# Patient Record
Sex: Male | Born: 1950
Health system: Southern US, Community
[De-identification: ages and names within clinical notes are randomized; demographics above are authoritative.]

## PROBLEM LIST (undated history)

## (undated) DIAGNOSIS — I219 Acute myocardial infarction, unspecified: Secondary | ICD-10-CM

## (undated) DIAGNOSIS — M1990 Unspecified osteoarthritis, unspecified site: Secondary | ICD-10-CM

## (undated) DIAGNOSIS — F419 Anxiety disorder, unspecified: Secondary | ICD-10-CM

## (undated) DIAGNOSIS — H269 Unspecified cataract: Secondary | ICD-10-CM

## (undated) DIAGNOSIS — F32A Depression, unspecified: Secondary | ICD-10-CM

## (undated) DIAGNOSIS — I10 Essential (primary) hypertension: Secondary | ICD-10-CM

## (undated) DIAGNOSIS — F329 Major depressive disorder, single episode, unspecified: Secondary | ICD-10-CM

## (undated) DIAGNOSIS — T7840XA Allergy, unspecified, initial encounter: Secondary | ICD-10-CM

## (undated) DIAGNOSIS — D649 Anemia, unspecified: Secondary | ICD-10-CM

## (undated) HISTORY — DX: Essential (primary) hypertension: I10

## (undated) HISTORY — DX: Major depressive disorder, single episode, unspecified: F32.9

## (undated) HISTORY — DX: Unspecified cataract: H26.9

## (undated) HISTORY — DX: Allergy, unspecified, initial encounter: T78.40XA

## (undated) HISTORY — DX: Acute myocardial infarction, unspecified: I21.9

## (undated) HISTORY — DX: Depression, unspecified: F32.A

## (undated) HISTORY — PX: FRACTURE SURGERY: SHX138

## (undated) HISTORY — DX: Unspecified osteoarthritis, unspecified site: M19.90

## (undated) HISTORY — PX: EYE SURGERY: SHX253

## (undated) HISTORY — DX: Anemia, unspecified: D64.9

## (undated) HISTORY — PX: CORONARY ARTERY BYPASS GRAFT: SHX141

## (undated) HISTORY — DX: Anxiety disorder, unspecified: F41.9

---

## 1998-02-17 ENCOUNTER — Inpatient Hospital Stay (HOSPITAL_COMMUNITY): Admission: EM | Admit: 1998-02-17 | Discharge: 1998-02-18 | Payer: Self-pay | Admitting: Cardiology

## 2001-03-26 ENCOUNTER — Encounter: Payer: Self-pay | Admitting: Cardiology

## 2001-03-26 ENCOUNTER — Inpatient Hospital Stay (HOSPITAL_COMMUNITY): Admission: AD | Admit: 2001-03-26 | Discharge: 2001-04-03 | Payer: Self-pay | Admitting: Cardiology

## 2001-03-26 ENCOUNTER — Encounter: Payer: Self-pay | Admitting: Family Medicine

## 2001-04-17 ENCOUNTER — Ambulatory Visit (HOSPITAL_COMMUNITY): Admission: RE | Admit: 2001-04-17 | Discharge: 2001-04-17 | Payer: Self-pay | Admitting: Cardiovascular Disease

## 2001-04-30 ENCOUNTER — Ambulatory Visit (HOSPITAL_COMMUNITY): Admission: RE | Admit: 2001-04-30 | Discharge: 2001-05-01 | Payer: Self-pay | Admitting: Internal Medicine

## 2002-05-16 ENCOUNTER — Inpatient Hospital Stay (HOSPITAL_COMMUNITY): Admission: EM | Admit: 2002-05-16 | Discharge: 2002-05-19 | Payer: Self-pay | Admitting: Emergency Medicine

## 2002-05-16 ENCOUNTER — Encounter: Payer: Self-pay | Admitting: Emergency Medicine

## 2002-06-27 ENCOUNTER — Encounter (HOSPITAL_COMMUNITY): Admission: RE | Admit: 2002-06-27 | Discharge: 2002-07-27 | Payer: Self-pay | Admitting: *Deleted

## 2002-07-31 ENCOUNTER — Encounter (HOSPITAL_COMMUNITY): Admission: RE | Admit: 2002-07-31 | Discharge: 2002-08-30 | Payer: Self-pay | Admitting: *Deleted

## 2004-12-01 ENCOUNTER — Ambulatory Visit (HOSPITAL_COMMUNITY): Admission: RE | Admit: 2004-12-01 | Discharge: 2004-12-01 | Payer: Self-pay | Admitting: Cardiology

## 2004-12-05 ENCOUNTER — Inpatient Hospital Stay (HOSPITAL_COMMUNITY): Admission: EM | Admit: 2004-12-05 | Discharge: 2004-12-08 | Payer: Self-pay | Admitting: Emergency Medicine

## 2004-12-23 ENCOUNTER — Ambulatory Visit (HOSPITAL_COMMUNITY): Admission: AD | Admit: 2004-12-23 | Discharge: 2004-12-24 | Payer: Self-pay | Admitting: Cardiology

## 2005-04-04 ENCOUNTER — Ambulatory Visit (HOSPITAL_COMMUNITY): Admission: RE | Admit: 2005-04-04 | Discharge: 2005-04-04 | Payer: Self-pay | Admitting: Cardiology

## 2005-07-12 ENCOUNTER — Inpatient Hospital Stay (HOSPITAL_BASED_OUTPATIENT_CLINIC_OR_DEPARTMENT_OTHER): Admission: RE | Admit: 2005-07-12 | Discharge: 2005-07-12 | Payer: Self-pay | Admitting: Cardiology

## 2006-03-29 ENCOUNTER — Ambulatory Visit (HOSPITAL_COMMUNITY): Admission: RE | Admit: 2006-03-29 | Discharge: 2006-03-29 | Payer: Self-pay | Admitting: Cardiology

## 2006-04-25 ENCOUNTER — Inpatient Hospital Stay (HOSPITAL_BASED_OUTPATIENT_CLINIC_OR_DEPARTMENT_OTHER): Admission: RE | Admit: 2006-04-25 | Discharge: 2006-04-25 | Payer: Self-pay | Admitting: Cardiology

## 2006-05-04 ENCOUNTER — Ambulatory Visit (HOSPITAL_COMMUNITY): Admission: RE | Admit: 2006-05-04 | Discharge: 2006-05-05 | Payer: Self-pay | Admitting: Cardiology

## 2007-02-27 ENCOUNTER — Inpatient Hospital Stay (HOSPITAL_BASED_OUTPATIENT_CLINIC_OR_DEPARTMENT_OTHER): Admission: RE | Admit: 2007-02-27 | Discharge: 2007-02-27 | Payer: Self-pay | Admitting: Cardiology

## 2007-03-23 ENCOUNTER — Ambulatory Visit (HOSPITAL_COMMUNITY): Admission: RE | Admit: 2007-03-23 | Discharge: 2007-03-23 | Payer: Self-pay | Admitting: Cardiology

## 2008-05-15 ENCOUNTER — Encounter: Admission: RE | Admit: 2008-05-15 | Discharge: 2008-05-15 | Payer: Self-pay | Admitting: Family Medicine

## 2008-06-10 ENCOUNTER — Encounter (HOSPITAL_COMMUNITY): Admission: RE | Admit: 2008-06-10 | Discharge: 2008-08-20 | Payer: Self-pay | Admitting: Cardiology

## 2008-08-06 ENCOUNTER — Encounter (INDEPENDENT_AMBULATORY_CARE_PROVIDER_SITE_OTHER): Payer: Self-pay | Admitting: General Surgery

## 2008-08-06 ENCOUNTER — Ambulatory Visit (HOSPITAL_COMMUNITY): Admission: RE | Admit: 2008-08-06 | Discharge: 2008-08-07 | Payer: Self-pay | Admitting: General Surgery

## 2009-03-25 ENCOUNTER — Encounter (HOSPITAL_COMMUNITY): Admission: RE | Admit: 2009-03-25 | Discharge: 2009-05-19 | Payer: Self-pay | Admitting: Cardiology

## 2009-06-05 ENCOUNTER — Inpatient Hospital Stay (HOSPITAL_COMMUNITY): Admission: EM | Admit: 2009-06-05 | Discharge: 2009-06-07 | Payer: Self-pay | Admitting: Emergency Medicine

## 2010-02-06 ENCOUNTER — Encounter: Payer: Self-pay | Admitting: Cardiology

## 2010-03-18 ENCOUNTER — Other Ambulatory Visit (HOSPITAL_COMMUNITY): Payer: Self-pay | Admitting: Cardiology

## 2010-03-18 ENCOUNTER — Ambulatory Visit
Admission: RE | Admit: 2010-03-18 | Discharge: 2010-03-18 | Disposition: A | Discharge status: Home / Self Care | Payer: MEDICARE | Source: Ambulatory Visit | Attending: Cardiology | Admitting: Cardiology

## 2010-03-18 ENCOUNTER — Other Ambulatory Visit: Payer: Self-pay | Admitting: Cardiology

## 2010-03-18 DIAGNOSIS — I2510 Atherosclerotic heart disease of native coronary artery without angina pectoris: Secondary | ICD-10-CM

## 2010-04-05 LAB — CBC
HCT: 21.9 % — ABNORMAL LOW (ref 39.0–52.0)
HCT: 30.2 % — ABNORMAL LOW (ref 39.0–52.0)
Hemoglobin: 7.7 g/dL — ABNORMAL LOW (ref 13.0–17.0)
Hemoglobin: 9.5 g/dL — ABNORMAL LOW (ref 13.0–17.0)
MCHC: 30.5 g/dL (ref 30.0–36.0)
MCHC: 31.6 g/dL (ref 30.0–36.0)
MCV: 58.4 fL — ABNORMAL LOW (ref 78.0–100.0)
MCV: 64.9 fL — ABNORMAL LOW (ref 78.0–100.0)
MCV: 69.4 fL — ABNORMAL LOW (ref 78.0–100.0)
Platelets: 514 10*3/uL — ABNORMAL HIGH (ref 150–400)
RBC: 3.75 MIL/uL — ABNORMAL LOW (ref 4.22–5.81)
RBC: 3.84 MIL/uL — ABNORMAL LOW (ref 4.22–5.81)
RBC: 3.96 MIL/uL — ABNORMAL LOW (ref 4.22–5.81)
RBC: 4.35 MIL/uL (ref 4.22–5.81)
RDW: 30 % — ABNORMAL HIGH (ref 11.5–15.5)
RDW: 32 % — ABNORMAL HIGH (ref 11.5–15.5)
WBC: 8.4 10*3/uL (ref 4.0–10.5)
WBC: 8.5 10*3/uL (ref 4.0–10.5)

## 2010-04-05 LAB — COMPREHENSIVE METABOLIC PANEL
ALT: 12 U/L (ref 0–53)
ALT: 12 U/L (ref 0–53)
ALT: 16 U/L (ref 0–53)
AST: 19 U/L (ref 0–37)
AST: 20 U/L (ref 0–37)
Alkaline Phosphatase: 61 U/L (ref 39–117)
Alkaline Phosphatase: 66 U/L (ref 39–117)
BUN: 11 mg/dL (ref 6–23)
CO2: 24 mEq/L (ref 19–32)
CO2: 28 mEq/L (ref 19–32)
Calcium: 8.6 mg/dL (ref 8.4–10.5)
Calcium: 8.9 mg/dL (ref 8.4–10.5)
Chloride: 108 mEq/L (ref 96–112)
Creatinine, Ser: 1.24 mg/dL (ref 0.4–1.5)
GFR calc Af Amer: >60 mL/min (ref >60)
GFR calc Af Amer: >60 mL/min (ref >60)
GFR calc non Af Amer: 59 mL/min — ABNORMAL LOW (ref >60)
GFR calc non Af Amer: >60 mL/min (ref >60)
Glucose, Bld: 91 mg/dL (ref 70–99)
Glucose, Bld: 91 mg/dL (ref 70–99)
Potassium: 4.1 mEq/L (ref 3.5–5.1)
Potassium: 4.1 mEq/L (ref 3.5–5.1)
Sodium: 136 mEq/L (ref 135–145)
Sodium: 137 mEq/L (ref 135–145)
Sodium: 137 mEq/L (ref 135–145)
Total Bilirubin: 0.8 mg/dL (ref 0.3–1.2)
Total Protein: 6.3 g/dL (ref 6.0–8.3)
Total Protein: 6.5 g/dL (ref 6.0–8.3)

## 2010-04-05 LAB — DIFFERENTIAL
Basophils Absolute: 0 10*3/uL (ref 0.0–0.1)
Basophils Relative: 0 % (ref 0–1)
Basophils Relative: 1 % (ref 0–1)
Eosinophils Relative: 0 % (ref 0–5)
Eosinophils Relative: 2 % (ref 0–5)
Eosinophils Relative: 2 % (ref 0–5)
Lymphs Abs: 2.1 10*3/uL (ref 0.7–4.0)
Lymphs Abs: 2.4 10*3/uL (ref 0.7–4.0)
Lymphs Abs: 2.6 10*3/uL (ref 0.7–4.0)
Monocytes Absolute: 0.8 10*3/uL (ref 0.1–1.0)
Monocytes Absolute: 1.3 10*3/uL — ABNORMAL HIGH (ref 0.1–1.0)
Monocytes Relative: 10 % (ref 3–12)
Monocytes Relative: 13 % — ABNORMAL HIGH (ref 3–12)
Monocytes Relative: 13 % — ABNORMAL HIGH (ref 3–12)
Neutro Abs: 6.5 10*3/uL (ref 1.7–7.7)
Neutrophils Relative %: 61 % (ref 43–77)

## 2010-04-05 LAB — BASIC METABOLIC PANEL
GFR calc non Af Amer: >60 mL/min (ref >60)
Potassium: 3.9 mEq/L (ref 3.5–5.1)
Sodium: 137 mEq/L (ref 135–145)

## 2010-04-05 LAB — IRON AND TIBC
Iron: 25 ug/dL — ABNORMAL LOW (ref 42–135)
Saturation Ratios: 6 % — ABNORMAL LOW (ref 20–55)
TIBC: 428 ug/dL (ref 215–435)

## 2010-04-05 LAB — CROSSMATCH
ABO/RH(D): O POS
Antibody Screen: NEGATIVE

## 2010-04-05 LAB — CARDIAC PANEL(CRET KIN+CKTOT+MB+TROPI)
CK, MB: 1.2 ng/mL (ref 0.3–4.0)
Relative Index: INVALID (ref 0.0–2.5)
Total CK: 63 U/L (ref 7–232)

## 2010-04-05 LAB — HEPATIC FUNCTION PANEL
ALT: 18 U/L (ref 0–53)
AST: 23 U/L (ref 0–37)
Bilirubin, Direct: <0.1 mg/dL (ref 0.0–0.3)

## 2010-04-05 LAB — TROPONIN I: Troponin I: 0.04 ng/mL (ref 0.00–0.06)

## 2010-04-05 LAB — POCT CARDIAC MARKERS
CKMB, poc: <1 ng/mL — ABNORMAL LOW (ref 1.0–8.0)
Myoglobin, poc: 56.5 ng/mL (ref 12–200)
Myoglobin, poc: 68.9 ng/mL (ref 12–200)
Troponin i, poc: 0.07 ng/mL (ref 0.00–0.09)

## 2010-04-05 LAB — CK TOTAL AND CKMB (NOT AT ARMC)
CK, MB: 1.2 ng/mL (ref 0.3–4.0)
Total CK: 67 U/L (ref 7–232)

## 2010-04-05 LAB — FOLATE: Folate: >20 ng/mL

## 2010-04-05 LAB — MAGNESIUM
Magnesium: 2 mg/dL (ref 1.5–2.5)
Magnesium: 2.1 mg/dL (ref 1.5–2.5)

## 2010-04-05 LAB — FERRITIN: Ferritin: 3 ng/mL — ABNORMAL LOW (ref 22–322)

## 2010-04-12 ENCOUNTER — Other Ambulatory Visit (HOSPITAL_COMMUNITY): Payer: Self-pay

## 2010-04-12 ENCOUNTER — Encounter (HOSPITAL_COMMUNITY)
Admission: RE | Admit: 2010-04-12 | Discharge: 2010-04-12 | Disposition: A | Discharge status: Home / Self Care | Payer: MEDICARE | Source: Ambulatory Visit | Attending: Cardiology | Admitting: Cardiology

## 2010-04-12 ENCOUNTER — Ambulatory Visit (HOSPITAL_COMMUNITY)
Admission: RE | Admit: 2010-04-12 | Discharge: 2010-04-12 | Disposition: A | Discharge status: Home / Self Care | Payer: MEDICARE | Source: Ambulatory Visit | Attending: Cardiology | Admitting: Cardiology

## 2010-04-12 DIAGNOSIS — E785 Hyperlipidemia, unspecified: Secondary | ICD-10-CM | POA: Insufficient documentation

## 2010-04-12 DIAGNOSIS — R079 Chest pain, unspecified: Secondary | ICD-10-CM | POA: Insufficient documentation

## 2010-04-12 DIAGNOSIS — I10 Essential (primary) hypertension: Secondary | ICD-10-CM | POA: Insufficient documentation

## 2010-04-12 DIAGNOSIS — I252 Old myocardial infarction: Secondary | ICD-10-CM | POA: Insufficient documentation

## 2010-04-12 MED ORDER — TECHNETIUM TC 99M TETROFOSMIN IV KIT
10.0000 | PACK | Freq: Once | INTRAVENOUS | Status: AC | PRN
  Administered 2010-04-12: 10 via INTRAVENOUS

## 2010-04-12 MED ORDER — TECHNETIUM TC 99M TETROFOSMIN IV KIT
30.0000 | PACK | Freq: Once | INTRAVENOUS | Status: AC | PRN
  Administered 2010-04-12: 30 via INTRAVENOUS

## 2010-04-25 LAB — HEPATIC FUNCTION PANEL
Indirect Bilirubin: 1.1 mg/dL — ABNORMAL HIGH (ref 0.3–0.9)
Total Protein: 7.2 g/dL (ref 6.0–8.3)

## 2010-04-25 LAB — COMPREHENSIVE METABOLIC PANEL
CO2: 28 mEq/L (ref 19–32)
Calcium: 10 mg/dL (ref 8.4–10.5)
Creatinine, Ser: 1.25 mg/dL (ref 0.4–1.5)
GFR calc Af Amer: >60 mL/min (ref >60)
GFR calc non Af Amer: 59 mL/min — ABNORMAL LOW (ref >60)
Glucose, Bld: 103 mg/dL — ABNORMAL HIGH (ref 70–99)

## 2010-04-25 LAB — PROTIME-INR
INR: 0.9 (ref 0.00–1.49)
Prothrombin Time: 12.6 seconds (ref 11.6–15.2)

## 2010-04-25 LAB — CBC
MCHC: 31.5 g/dL (ref 30.0–36.0)
MCV: 75.6 fL — ABNORMAL LOW (ref 78.0–100.0)
RBC: 4.36 MIL/uL (ref 4.22–5.81)

## 2010-04-25 LAB — APTT: aPTT: 31 seconds (ref 24–37)

## 2010-04-27 LAB — HEPATIC FUNCTION PANEL
ALT: 22 U/L (ref 0–53)
Albumin: 3.7 g/dL (ref 3.5–5.2)
Alkaline Phosphatase: 85 U/L (ref 39–117)
Total Protein: 7.8 g/dL (ref 6.0–8.3)

## 2010-04-27 LAB — BASIC METABOLIC PANEL
Chloride: 103 mEq/L (ref 96–112)
GFR calc Af Amer: >60 mL/min (ref >60)
Potassium: 4 mEq/L (ref 3.5–5.1)

## 2010-04-27 LAB — HEMOGLOBIN A1C: Hgb A1c MFr Bld: 5.8 % (ref 4.6–6.1)

## 2010-04-27 LAB — LIPID PANEL
Cholesterol: 278 mg/dL — ABNORMAL HIGH (ref 0–200)
LDL Cholesterol: 185 mg/dL — ABNORMAL HIGH (ref 0–99)
Total CHOL/HDL Ratio: 5.8 RATIO
Triglycerides: 226 mg/dL — ABNORMAL HIGH (ref <150)
VLDL: 45 mg/dL — ABNORMAL HIGH (ref 0–40)

## 2010-06-01 NOTE — Op Note (Signed)
[name], [name]                 ACCOUNT NO.:  [id number]   MEDICAL RECORD NO.:  [id number]          PATIENT TYPE:  AMB   LOCATION:  DAY                          FACILITY:  [facility]   PHYSICIAN:  Almond Lint, MD       DATE OF BIRTH:  05/06/1950   DATE OF PROCEDURE:  08/06/2008  DATE OF DISCHARGE:                               OPERATIVE REPORT   REFERRING PHYSICIAN:  Clydie Braun L. Hal Hope, M.D. and Eduardo Osier. Sharyn Lull, M.D.   PREOPERATIVE DIAGNOSIS:  Symptomatic cholelithiasis.   POSTOPERATIVE DIAGNOSIS:  Symptomatic cholelithiasis.   PROCEDURE PERFORMED:  Laparoscopic cholecystectomy.   SURGEON:  Almond Lint, MD.   ANESTHESIA:  General and local.   FINDINGS:  Small gallbladder with stones.   SPECIMEN:  Gallbladder to pathology.   EBL:  Minimal.   COMPLICATIONS:  None known.   PROCEDURE:  [name] was identified in the holding area and taken to  the operating room, where he was placed supine on the operating room  table.  General anesthesia was induced.  The abdomen was prepped and  draped in a sterile fashion.  Time-out was performed according to the  surgical safety check list.  When all was correct, we continued.  The  infraumbilical skin was incised with a #11 blade in a transverse  curvilinear fashion.  The subcutaneous tissues were spread with a Kelly  clamp.  The umbilical stalk was elevated with a Kocher and the fascia  was identified in the midline.  This was then elevated on either side of  the midline with Kochers and the fascia incised with a #11 blade.  A  Kelly clamp was used to confirm entrance into the peritoneal cavity.  A  0 Vicryl pursestring suture was placed around this fascial incision.  A  Hasson trocar was introduced into the abdomen and held in place with the  tails of the pursestring.  Pneumoperitoneum was achieved to a pressure  of 15 mmHg.   The epigastric region was identified and anesthetized with a mixture 1%  lidocaine and 0.25% Marcaine.  The  11 blade was used to make 1.2-cm  incision and an 11-mm trocar was placed in this region.  In a similar  fashion, two 5-mm trocars were placed in the right upper quadrant.  The  gallbladder was grasped with a locking grasper and elevated toward the  head.  The infundibulum was then grasped and retracted laterally.  A  [place] dissector was used to [employer] the cystic duct.  The Bovie  was used to open up the peritoneum overlying the gallbladder.  The  [place] was then used to further skeletonize the duct and the artery.  There was minimal inflammatory tissue around this.  There were 2 very  small structures entering the gallbladder directly and the small  structure consistent with the artery started to bleed with  skeletization. Two clips were placed on the patient's side and one on  the specimen side of the artery. The artery was then transected.  Three  clips were then placed on the patient's side of the duct and  one on the  specimen side.  This was then clipped.  The hook electrocautery was then  used to take the gallbladder off the gallbladder fossa.  At one point  the gallbladder was entered.  There was no spillage of stones.  The  gallbladder was then placed into an EndoCatch bag and removed through  the umbilical incision.  Once pneumoperitoneum was re-achieved, there  was a small amount of oozing from the gallbladder fossa.  This was  coagulated with the Bovie electrocautery.  The right upper quadrant and  gallbladder fossa were irrigated and suctioned.  The gallbladder fossa  was reexamined and there was no evidence of any bleeding.  The clips  were visualized and were in place with no evidence of bleeding or bile  leakage.  The upper quadrant and epigastric ports were removed under  direct visualization, with no evidence of bleeding.  The patient was  returned to the supine position and the rest of the pneumoperitoneum was  allowed to evacuate.  The fascial incision was then  closed with a  pursestring suture.  This was palpated and there were no additional  defects seen.  The skin of all the incisions was closed using 4-0  Monocryl. The skin was then cleaned, dried and dressed with Dermabond.  The patient was awakened from anesthesia and taken to the PACU in stable  condition.      Almond Lint, MD  Electronically Signed     FB/MEDQ  D:  08/06/2008  T:  08/06/2008  Job:  [id number]   cc:   Clydie Braun L. Hal Hope, M.D.  Fax: [phone]   Eduardo Osier. Sharyn Lull, M.D.  Fax: [phone]

## 2010-06-01 NOTE — Cardiovascular Report (Signed)
NAMERENLY, ROOTS                 ACCOUNT NO.:  [id number]   MEDICAL RECORD NO.:  [id number]          PATIENT TYPE:  OIB   LOCATION:  1965                         FACILITY:  [facility]   PHYSICIAN:  Mohan N. Sharyn Lull, M.D. DATE OF BIRTH:  [birth year]   DATE OF PROCEDURE:  02/27/2007  DATE OF DISCHARGE:                            CARDIAC CATHETERIZATION   PROCEDURE:  Left cardiac cath with selective left and right coronary  angiography, visualization of saphenous vein graft and LIMA graft.  LV  graft via right groin using Judkins technique.   INDICATIONS FOR PROCEDURE:  Mr. Huelsmann is a 60 year old black male with  past medical history significant for coronary artery disease, history of  inferoposterior wall MI in November 2006, history of also MI in 1989,  status post CABG. He had LIMA to the LAD and saphenous vein graft to OM-  1,  status post PCI in saphenous vein graft to OM-1 in November 2006,  and PTCA stenting to left main in December 2006, hypertension,  hypercholesteremia, history of gouty arthritis, tobacco abuse.  Complains of retrosternal chest tightness off and on,  grade 5 of 10,  associated with left arm numbness.  States chest pain last 20-30  minutes.  Denies any nausea, vomiting, diaphoresis.  Denies palpitation,  lightheadedness, syncope.  Denies PND, orthopnea, leg swelling.  The  patient states he had chest pain few months ago, lasting more than half  an hour, but did not seek any medical attention.  EKG done in the office  showed normal sinus rhythm with LVH with ST-T wave changes in  inferolateral leads which were new as compared to prior EKG.   PAST MEDICAL HISTORY:  As above.  In the past he had CABG in 1989.  Had  jaw and neck surgery many years ago.   ALLERGIES:  NO KNOWN DRUG ALLERGIES.   MEDICATIONS:  At home he is on:  1. Enteric-coated aspirin 81 mg p.o. daily.  2. Plavix 75 mg p.o. daily.  3. Altace 10 mg p.o. b.i.d.  4. Toprol XL 50 mg p.o. daily.  5.  Lipitor 40 mg p.o. daily.   SOCIAL HISTORY:  Married, has four children.  Smoked one pack per day  for 40 years and now smokes half pack per day.  Drinks whiskey  occasionally socially.   FAMILY HISTORY:  Noncontributory.   PHYSICAL EXAMINATION:  GENERAL:  On examination he is alert and oriented  x3, in no acute distress.  VITAL SIGNS:  Blood pressure was 140/90, pulse was 93 and regular,  conjunctivae pink.  NECK:  Supple.  No JVD, no bruit.  LUNGS:  Clear to auscultation without rhonchi or rales.  CARDIOVASCULAR:  S1 and S2 normal.  There was soft S4 gallop.  ABDOMEN:  Soft.  Bowel sounds present, nontender.  EXTREMITIES:  There is no clubbing, cyanosis or edema.   IMPRESSION:  1. New onset angina, rule out restenosis versus progression of      coronary artery disease.  2. Coronary artery disease, status post coronary artery bypass      grafting.  3. History of  myocardial infarction x2.  4. Status post percutaneous coronary intervention to saphenous vein      graft and protected left main in the past uncontrolled      hypertension, hypercholesteremia and tobacco abuse.  We spoke with      him regarding noninvasive stress testing versus left cath, its      risks and benefits i.e. death, stroke, need for emergency CABG.      risk of restenosis. local vascular complications etc. and he      consented for the procedure.   PROCEDURE:  After obtaining informed consent, the patient was brought to  the cath lab and was placed on fluoroscopy table.  Right groin was  prepped and draped in usual fashion.  2% Xylocaine was used for local  anesthesia in the right groin with the help of thin-wall needle.  A 4-  French arterial sheath was placed.  The sheath was aspirated and  flushed.  Next, 4-French left Judkins catheter was advanced over the  wire under fluoroscopic guidance into the ascending aorta.  Wire was  pulled out and the catheter was aspirated and connected to the manifold.   Catheter was further advanced and engaged into left coronary ostium.  Multiple views of the left system were taken.  Next, catheter was  disengaged and was pulled out over the wire and was replaced with 4-  [place], 3-D, right diagnostic catheter which was advanced over the wire  under fluoroscopic guidance up to the ascending aorta.  Wire was pulled  out, the catheter was aspirated and connected to the manifold.  Catheter  was further advanced and engaged into the right coronary ostium.  Multiple views of the right coronary system were obtained.  Next,  catheter was disengaged and was engaged into saphenous vein graft to OM-  1.  Multiple views of this graft were taken.  Next, catheter was  disengaged and was pulled out over the wire and was replaced with 4-  [place] LIMA diagnostic catheter which was advanced over the wire under  fluoroscopic guidance up to the ascending aorta.  Wire was pulled out,  the catheter was aspirated and connected to the manifold.  Catheter was  further advanced and engaged into LIMA to LAD.  Multiple views of this  graft were taken.  Next, the catheter was disengaged and was pulled out  over the wire and was replaced with 4-French pigtail catheter which was  advanced over the wire under fluoroscopic guidance up to the ascending  aorta.  Catheter was further advanced across aortic valve into the LV.  The  LV pressures were recorded.  Next, LV graft was done in 30 degrees  RAO position.  Post angiographic pressures were recorded from LV and  then pull-back pressures were recorded from the aorta.  There was no  gradient across the aortic valve.  Next, the pigtail catheter was pulled  out over the wire.  Sheaths aspirated and flushed.   FINDINGS:  The left ventricle showed severe inferior wall hypokinesia,  EF of 35-40%.  There was mild LVH.  Left main was patent.  LAD was 100%  occluded proximally.  Ramus has 85-90% ostial stenosis.  Left circumflex  has 30-40%  proximal and mid stenosis filling faintly OM-1.  The OM-2 and  OM-2 were very small.  The RCA had 15-20% proximal and mid stenosis.  Saphenous vein graft to OM-1 is 100% occluded at the ostium.  The LIMA  to LAD was patent, but had  large proximal intercostal artery which was  not tied.  The patient tolerated procedure well.  There no  complications.  The patient was transferred to recovery room in stable  condition.   PLAN:  Maximize his antianginal medication.  If he continues to have  chest pain, will get viability study prior to considering PCI.  This  completes the operative report on Mr. new growth on his medical record  number is [id number] presyncope to.  Cath lab and 211 mV thank you and to  Dr. Kevin Fenton stool thank you      Eduardo Osier. Sharyn Lull, M.D.  Electronically Signed    MNH/MEDQ  D:  02/27/2007  T:  03/01/2007  Job:  [id number]   cc:   Osvaldo Shipper. Spruill, M.D.

## 2010-06-04 NOTE — Procedures (Signed)
[facility]  Patient:    [name], [name] Visit Number: [id number] MRN: [id number]          Service Type: MED Location: 2000 2004 01 Attending Physician:  Learta Codding Dictated by:   Jacolyn Reedy, P.A.C. Proc. Date: 04/17/01 Admit Date:  03/26/2001 Discharge Date: 04/03/2001                                Stress Test  INDICATIONS:  This is a 60 year old with history of CABG in the early 1990s, who has had atrial flutter and underwent recent TEE-guided cardioversion.  He is scheduled for ablation and is referred for Cardiolite scan before undergoing ablation.  The patient stopped his Toprol two days prior to the test.  PROCEDURE:  Baseline EKG normal sinus rhythm.  The patient exercised 6 minutes 28 seconds of the Bruce protocol, obtaining a heart rate of 144.  Target heart rate was 143.  The test was stopped due to being tired and a blood pressure of 230/110.  Baseline blood pressure was 160/92.  The patient had no chest pain. He did have 1 to 1.5 mm upsloping ST depression inferolaterally.  Cardiolite images are to follow. Dictated by:   Jacolyn Reedy, P.A.C. Attending Physician:  Learta Codding DD:  04/17/01 TD:  04/18/01 Job: [id number]

## 2010-06-04 NOTE — Cardiovascular Report (Signed)
[name], [name]                           ACCOUNT NO.:  [id number]   MEDICAL RECORD NO.:  [id number]                   PATIENT TYPE:  INP   LOCATION:  2006                                 FACILITY:  [facility]   PHYSICIAN:  Salvadore Farber, M.D.             DATE OF BIRTH:  [birth year]   DATE OF PROCEDURE:  05/16/2002  DATE OF DISCHARGE:  05/19/2002                              CARDIAC CATHETERIZATION   PROCEDURE:  Coronary and bypass graft angiography, left subclavian  angiography, left internal mammary artery angiography, left heart  catheterization, coronary angiography, stent to the saphenous vein graft to  the obtuse marginal using filter wire distal protection.   CARDIOLOGIST:  Salvadore Farber, M.D.   INDICATIONS:  The patient is a 60 year old gentleman status post coronary  artery bypass grafting in 1989 by Dr. Tyrone Sage with LIMA to the LAD and  saphenous vein graft to the obtuse marginal.  He now presents with one day  history of chest discomfort occurring at rest.  The pain was relieved in the  emergency room with medical therapy.  He has had a modest elevation in  troponin.  He is therefore brought to the lab for diagnostic angiography  with percutaneous intervention.   DESCRIPTION OF PROCEDURE:  Informed consent was obtained.  Under 1%  Xylocaine local anesthesia, a 6 French sheath was placed in the right  femoral artery using the modified Seldinger technique.  Diagnostic  angiography was performed using JL4 and JR4 catheters to the native  coronaries, JR4 for the vein graft to the OM, JR4 to engage the left  subclavian artery and LIMA catheter to selectively engage the left internal  mammary artery.  Ventriculography was performed using a pigtail catheter in  the RAO projection.   The case was then turned to intervention.  The sheath was upsized to a 7  [place].  Additional heparin was given to achieve an ACT of greater than 200  seconds.  A 7 [place] guide was  advanced over a wire and engaged in the  ostium of the vein graft to the obtuse marginal.  The filter wire was  advanced without difficulty into the mid portion of the graft, distal to the  lesion.  There it was deployed.  Apposition of the filter wire basket was  confirmed on two orthogonal views.  The lesion was then directly stented  using a 3.5 x 16 Taxus stent at 16 atmospheres.  The lesion was post dilated  using a 3.75 x 13 mm PowerSail at 16 atmospheres distally and 18 atmospheres  proximally and mid.  The filter wire was then retrieved.  Final angiograms  demonstrated no residual stenosis and TIMI-3 flow to the distal vessel.   COMPLICATIONS:  None.   DIAGNOSTIC FINDINGS:  1. LV:  157/9/23.  EF of 65% without regional wall motion abnormality.  2. No aortic stenosis.  Trace mitral regurgitation.  3. Left main:  80% distal stenosis.  4. Left anterior descending:  The LAD has an ostial 90% stenosis and then is     occluded after the takeoff of the first septal perforator.  The LIMA to     the LAD is widely patent.  There is a 70% stenosis in the mid LAD     proximal to the touchdown of the LIMA.  There is no left subclavian     stenosis.  5. Circumflex:  The circumflex is a large vessel.  The native circumflex     supplies minia ml territory.  The first obtuse marginal is occluded.  The     saphenous vein graft to the first obtuse marginal has a 90% stenosis in     its proximal portion.  This was successfully stented to no residual     stenosis.  TIMI-3 flow was maintained.  6. Right coronary artery:  The RCA is a small but dominant vessel.  There is     40% stenosis of the mid section in a segment of diffuse mild disease.   IMPRESSION/RECOMMENDATIONS:  1. Successful stenting of culprit lesion of the saphenous vein graft to the     obtuse marginal resulting in no residual stenosis and TIMI-3 flow.  A     drug-eluting stent was utilized.  Plavix should be continued for a      minimum of nine months given his acute presentation.  Aspirin will be     continued indefinitely.  Smoking cessation will be strongly advised.     Statin and ACE inhibitor will be initiated in the hospital.  2. Widely patent left internal mammary artery to left anterior descending     with no area of ischemia in the anterior wall.  3. Diffuse mild disease in the right coronary artery territory.  4. Preserved left ventricular systolic function.  5. No significant valvular disease.                                               Salvadore Farber, M.D.    WED/MEDQ  D:  05/17/2002  T:  05/19/2002  Job:  [id number]   cc:   Angus G. Renard Matter, M.D.  [address]  [place]  [place] [address]  Fax: [phone]   Willa Rough, M.D.

## 2010-06-04 NOTE — Cardiovascular Report (Signed)
NAMEGERARD, CANTARA NO.:  [id number]   MEDICAL RECORD NO.:  [id number]          PATIENT TYPE:  OIB   LOCATION:  1963                         FACILITY:  [facility]   PHYSICIAN:  Mohan N. Sharyn Lull, M.D. DATE OF BIRTH:  14-Apr-1950   DATE OF PROCEDURE:  07/12/2005  DATE OF DISCHARGE:                              CARDIAC CATHETERIZATION   PROCEDURE:  Left cardiac cath with selective left and right coronary  angiography, visualization of saphenous vein graft to OM1 and LIMA graft to  LAD, LV grafting via right groin using Judkins technique.   INDICATIONS FOR PROCEDURE:  Mr. Gerding is a 60 year old black male with  past medical history significant for coronary artery disease status post  inferoposterolateral wall MI in November 2006, status post emergency PTCA  stenting to saphenous vein graft to OM1 using proximal protection device,  history of MI in the past in 1989 status post CABG.  He had LIMA to LAD and  saphenous vein graft to OM1, status post PTCA stenting to protected distal  left main/left circumflex in December 2006, hypertension,  hypercholesteremia, history of gouty arthritis, tobacco abuse, complaints of  retrosternal chest tightness associated with right arm numbness with minimal  exertion associated with nausea, relieved with rest.  Denies any  palpitation, lightheadedness or syncope.  Denies PND, orthopnea or leg  swelling.  The patient stopped his Plavix recently.  The patient was advised  to restart Plavix.  The patient also has restarted smoking approximately  half pack per day.   PAST MEDICAL HISTORY:  As above.   PAST SURGICAL HISTORY:  She had CABG x2 in 1989.  Had jaw and neck fracture  many years ago.   ALLERGIES:  NO KNOWN DRUG ALLERGIES.   MEDICATIONS AT HOME:  He is on aspirin 325 mg p.o. daily, Plavix, which was  restarted 75 mg p.o. daily, Altace 5 mg p.o. daily, Lipitor 40 mg p.o.  daily, Imdur 60 mg p.o. q.a.m., which was also added  just recently.   SOCIAL HISTORY:  He is married, has four children.  Smoked one pack per day  for 20+ years, quit 6 months ago prior to MI and then has restarted smoking  approximately half pack per day and drinks whiskey occasionally.   FAMILY HISTORY:  Noncontributory.   PHYSICAL EXAMINATION:  He is alert, awake and oriented x3 in no acute  distress.  Blood pressure is 140/80, pulse of 68.  Conjunctivae pink.  Neck:  Supple.  No JVD, no bruit.  Lungs were clear to auscultation without rhonchi  or rales.  Cardiovascular:  S1, S2 normal.  There was a soft systolic  murmur.  Abdomen:  Soft.  Bowel sounds are present, nontender.  EXTREMITIES:  There is no clubbing, cyanosis or edema.   IMPRESSION:  New onset angina, coronary artery disease, status post  inferoposterolateral wall myocardial infarction and status post percutaneous  coronary intervention (PCI) to saphenous vein graft to obtuse marginal 1  (OM1) in November 2006 and also PCI to distal left main and ostial left  circumflex in December 2006, coronary artery disease  status post coronary  artery bypass grafting in 1989, hypertension, hypercholesteremia, tobacco  abuse, gouty arthritis.  Discussed with the patient regarding left cath, its  risks and benefits, i.e. death, myocardial infarction, stroke, need for  emergency coronary artery bypass grafting, local vascular complications,  __________ consented for the procedure.   PROCEDURE:  After obtaining the informed consent, the patient was brought to  the cath lab and was placed on fluoroscopy table.  Right groin was prepped  and draped in usual fashion.  2% Xylocaine was used for local anesthesia in  the right groin.  With the help of thin-wall needle, 4 French arterial  sheath was placed.  Sheath was aspirated and flushed.  Next, 4-French left  Judkins catheter was advanced over the wire under fluoroscopic guidance up  to the ascending aorta.  Wire was pulled out.  The  catheter was aspirated  and connected to the manifold.  Catheter was further advanced and engaged  into left coronary ostium.  Multiple views of the left system were taken.  Next, the catheter was disengaged and was pulled out over the wire and was  replaced with 4-French right.  Progressive right diagnostic catheter was  advanced over the wire under fluoroscopic guidance up to the ascending  aorta.  Wire was pulled out.  The catheter was aspirated and connected to  the manifold.  Catheter was advanced and engaged into LIMA to LAD.  Multiple  views of this graft were taken.  Next, the catheter was disengaged and was  engaged into saphenous vein graft to OM1.  Multiple views of this graft were  taken.  Next, this catheter was disengaged and engaged into RCA.  Multiple  views of RCA was taken.  Next, the catheter was disengaged and was pulled  out over the wire and was replaced with 4-French pigtail catheter which was  advanced over the wire under fluoroscopic guidance up to the ascending  aorta.  Catheter was further advanced across the aortic valve into the LV.  LV pressures were recorded.  Next, LV graft was done in 30 degrees RAO  position.  Post angiographic pressures were recorded from LV and then  pullback pressures were recorded from the aorta.  There was no gradient  across the aortic valve.  Next, a pigtail catheter was pulled out over the  wire.  Sheaths were aspirated and flushed.   FINDINGS:  LV was mildly enlarged.  There was mild inferior wall  hypokinesia, EF of 45-50%.  Left main was patent.  LAD was 100% occluded at  the ostium as before.  Ramus had a 75-80% ostial stenosis.  The vessel is  less than 1.25 mm and is not suitable for PCI.  Left circumflex has 20-30%  ostial in-stent re-stenosis with slight haziness with TIMI III distal flow.  RCA has 20-25% mid sequential stenosis.  Sequential graft to OM-1 has 50-60% ostial stenosis.  Stented mid portion is widely patent  with TIMI Grade III  distal flow.  LIMA to LAD is patent.  The patient tolerated the procedure  well.  There were no complications.  The patient was transferred to recovery  room in stable condition.   PLAN:  To maximize anti-anginal medications.  The patient has been advised  to continue aspirin and Plavix the rest of his life and also refrain from  smoking..           ______________________________  Eduardo Osier Sharyn Lull, M.D.     MNH/MEDQ  D:  07/12/2005  T:  07/12/2005  Job:  [id number]   cc:   Osvaldo Shipper. Spruill, M.D.  Fax: [phone]

## 2010-06-04 NOTE — Discharge Summary (Signed)
[place]. [facility]  Patient:    [name], [name] Visit Number: [id number] MRN: [id number]          Service Type: OUT Location: RAD Attending Physician:  Alice Reichert Dictated by:   Lavella Hammock, P.A.-C. Admit Date:  03/26/2001 Discharge Date: 03/26/2001   CC:         [name], M.D.  Nathen May, M.D., [facility]   Referring Physician Discharge [place]  DATE OF BIRTH:  [birth year]  PROCEDURES:  Transesophageal echocardiography cardioversion.  HOSPITAL COURSE:  [name] is a 60 year old male with a history of bypass surgery in 1990, who went to his primary care physician on the day of admission for shortness of breath.  He was found to be in atrial flutter with rapid ventricular response and was sent to [facility] for admission. The patient states that he has had increased dyspnea on exertion for 5-6 months and was also aware of increased heart rate at times.  He had no chest pain.  He had no symptoms similar to his pre-MI symptoms.  His shortness of breath and dyspnea on exertion, however, had been getting worse recently.  He was admitted for further evaluation of the atrial flutter and anticoagulation. The patient was placed on Lopressor at 25 mg p.o. b.i.d. which was bumped up to q.i.d. for better rate control.  An EP consult was called, and he was seen by Dr. Graciela Husbands, who felt that a TEE cardioversion was indicated with follow-up visits, and an ablation visit should be needed.  The patient had a TEE cardioversion on March 29, 2001, which showed global hypokinesis and an EF of 40%.  There was mild MR and mild LVH and a normal aortic valve with no thrombus.  Because there was no thrombus, he was anesthetized with sodium pentothal, and a 200 joule biphasic shock was delivered which converted him to normal sinus rhythm.  He tolerated the procedure well.  Initially, he was placed on aspirin 325 mg q.d. and heparin.  As his  Coumadin was started, his aspirin was decreased to 81 mg q.d.  He had four doses of Coumadin at 7.5 mg q.d. and one dose of Coumadin was scheduled but not yet given at 10 mg q.d.  His INR at discharge was 1.8.  He was scheduled to be continued on Lovenox until his INR was therapeutic at greater or equal to 2.0.  To evaluate his ventricle before the TEE was done, he had a regular 2-D echocardiogram.  By 2-D echocardiogram, his EF was 30% with TEE echocardiogram of 40%.  It showed moderate tricuspid valvular regurgitation and mostly global LV dysfunction, but it was felt that the apex moved better than other walls. The patient had no chest pain prior to admission, and initial enzymes were negative.  He is to follow up in the office and get a stress test at that time.  The patient had a history of hyperlipidemia but was not on any medications when he came to the hospital.  He had a cholesterol checked which showed an LDL of 132, so he was started on Lipitor at 10 mg q.d.  He needs follow-up lipid profile and liver enzymes checked in 6-12 weeks.  The patient was maintaining sinus rhythm on beta blockers after cardioversion, and his INR was 1.8.  He was started on Lovenox, and this is to be continued as an outpatient.  The patient was considered for stable for discharge on April 03, 2001.  LABORATORY VALUES:  INR at discharge 1.8.  Hemoglobin 12.1, hematocrit 35.3, WBC 9.5, platelets 184.  Fecal occult blood was negative.  TSH 5.037.  Sodium 137, potassium 4.2, chloride 107, CO2 28, BUN 16, creatinine 1.3, glucose 101. Magnesium 1.9.  Total cholesterol 195, triglycerides 66, HDL 50, LDL 132.  Chest x-ray:  Cardiomegaly with no evidence of acute disease.  DISCHARGE CONDITION:  Improved.  CONSULTS:  Dr. Sherryl Manges with electrophysiology.  DISCHARGE DIAGNOSES: 1. Atrial flutter with rapid ventricular response. 2. Status post aortocoronary bypass surgery in 1990 with left internal  mammary    artery to left anterior descending and saphenous vein graft to obtuse    marginal 1. 3. Status post cardiac catheterization in February 2000, with left main 80%,    left anterior descending and circumflex totalled, and right coronary artery    with 30-40% stenosis.  The saphenous vein graft to obtuse marginal 1 was    patent, and the left internal mammary artery to left anterior descending    had a 30% stenosis.  His ejection fraction was 64%. 4. Left ventricular dysfunction with an ejection fraction of 40% by TEE and    30% by 2-D echocardiogram. 5. History of gout. 6. Status post neck fracture. 7. History of osteoarthritis. 8. History of hyperlipidemia. 9. Ongoing tobacco use.  DISCHARGE INSTRUCTIONS: 1. His activity level is to be as tolerated. 2. He is to stick to a low salt and fat diet. 3. He is to get a PT/INR drawn at [facility] in [place], on Wednesday,    April 04, 2001, at 10:15. 4. He is to schedule a stress test in [place] at that time to follow up    there. 5. He is to see Dr. Graciela Husbands in [place] on April 25, 2001, at 2:45 p.m. 6. He is to follow-up with Dr. Renard Matter as scheduled.  DISCHARGE MEDICATIONS: 1. Lipitor 10 mg q.d. 2. Coumadin 5 mg tabs 1-1/2 tabs q.d. or as directed. 3. Aspirin 81 mg q.d. 4. Toprol XL 50 mg q.d. 5. Lovenox as directed. Dictated by:   Lavella Hammock, P.A.-C. Attending Physician:  Alice Reichert DD:  04/03/01 TD:  04/03/01 Job: [id number]

## 2010-06-04 NOTE — Cardiovascular Report (Signed)
NAMEXABI, WITTLER NO.:  [id number]   MEDICAL RECORD NO.:  [id number]          PATIENT TYPE:  INP   LOCATION:  2908                         FACILITY:  [facility]   PHYSICIAN:  Mohan N. Sharyn Lull, M.D. DATE OF BIRTH:  10-25-1950   DATE OF PROCEDURE:  12/05/2004  DATE OF DISCHARGE:                              CARDIAC CATHETERIZATION   PROCEDURE:  1.  Left cardiac cath with selective left and right coronary angiograph,      visualization of saphenous vein graft and LIMA graft, LV graft via right      groin using Judkins technique.  2.  Successful direct stenting to saphenous vein graft to OM-1 using 3.5 x      16 mm long Taxus drug-eluting stent using proximal Broxus proximal      protection device.   INDICATIONS FOR PROCEDURE:  Mr. Tripodi is 60 year old black male with past  medical history significant for coronary artery disease, history of MI  status post CABG in 1989. He had saphenous vein graft to OM-1 and LIMA to  LAD. He had PTCA stenting to the saphenous vein graft to OM-1 in 2004,  hypertension, hypercholesteremia, history of gouty arthritis, tobacco abuse.  He came to the ER complaining of retrosternal chest pain grade 10/10  associated with shortness of breath, diaphoresis and diaphoresis while  raking the leaves. He states the chest pain radiated into both arms. He  drove to the ER with his wife. EKG done in the ER showed normal sinus rhythm  with left atrial enlargement with ST elevation in inferolateral leads and ST  depression in V1 and V2 suggestive of inferoposterolateral wall injury  pattern. The patient received IV heparin, nitrates and aspirin in the ER and  was brought to the cath lab. Discussed with the patient and his wife  regarding emergency cath, possible PTCA stenting its risks and benefits,  i.e. death, MI, stroke, need for emergency CABG, risk of restenosis, local  vascular complications, etc. and consented for the procedure.   PAST  MEDICAL HISTORY:  As above.   PAST SURGICAL HISTORY:  Coronary artery bypass grafting as above in 1989. He  had LIMA to LAD and saphenous vein graft to OM-1. He had jaw fracture and  neck fracture many years ago.   ALLERGIES:  No known drug allergies.   MEDICATIONS:  At home, he is on Caduet, aspirin, allopurinol and  questionable two other blood pressure medicines; the name he does not  remember.   SOCIAL HISTORY:  He is married, has four children. Smoked two pack per week  now. He used to smoke one pack per day for 20+ years. Drinks whiskey  occasionally.   FAMILY HISTORY:  Noncontributory.   EXAMINATION:  He is alert and oriented x3 in no acute distress. Blood  pressure was 132/82, pulse was 89. Conjunctivae was pink. Neck was supple.  No JVD, no bruit. Lungs were clear to auscultation without rhonchi or rales.  Cardiovascular S1 and S2 was normal. There was soft S4 gallop. Abdomen was  soft. Bowel sounds were present, nontender. Extremities show there  is no  clubbing, cyanosis or edema.   INTERVENTIONAL PROCEDURE:  The patient was brought to the cath lab. After  obtaining the informed consent, the patient was brought to the cath lab and  was placed on fluoroscopy table. Right groin was prepped and draped in usual  fashion. 2% Xylocaine was used for local anesthesia in the right groin. With  the help of thin-wall needle, 7-French arterial and 6-French venous sheaths  were placed. Both the sheaths were aspirated and flushed. Next, 6-French  left Judkins catheter was advanced over the wire under fluoroscopic guidance  up to the ascending aorta. Wire was pulled out, the catheter was aspirated  and connected to the manifold. Catheter was further advanced and engaged  into left coronary ostium. Multiple views of the left system were taken.  Next, the catheter was disengaged and was pulled out over the wire and was  replaced with 6-French right Judkins catheter which was advanced  over the  wire under fluoroscopic guidance up the ascending aorta. Wire was pulled  out, the catheter was aspirated and connected to the manifold. Catheter was  further advanced and engaged into right coronary ostium. Multiple views of  the right system were taken. Next catheter was disengaged and engaged into  saphenous vein graft to OM-1. Multiple views of this graft were taken. Next,  the catheter was disengaged and was engaged into LIMA to LAD. Multiple views  of this graft were taken. Next, the catheter was disengaged and was pulled  out over the wire. Sheaths were aspirated and flushed.   FINDINGS:  Inferior wall LV showed inferior mid-wall hypokinesia. There was  2+ MR. EF of 50-55%. Left main had 80-90% distal stenosis. LAD was 100%  occluded at the ostium. Ramus was less than 1 mm which was diffusely  diseased. Left circumflex has 80-85% ostial stenosis. OM-1 was 100%  occluded. RCA had 20-30% mid-sequential stenosis. LIMA to LAD was patent.  Native LAD at the apex was diffusely diseased. Large intercostal vessel  coming off the LIMA was also patent which was not ligated. There was TIMI  III flow in distal LAD. Saphenous vein graft to OM-1 was 100% occluded in  the midportion with TIMI 0 flow.   7-FRENCH INTERVENTIONAL PROCEDURE:  A 7-French left bypass guiding catheter  was advanced over the wire under fluoroscopic guidance into the ascending  aorta. Wire was pulled out, the catheter was aspirated and connected to the  manifold. Catheter was further advanced and engaged into saphenous vein  graft to OM-1. Next, 0.014 high torque floppy wire was advanced into the  saphenous vein graft to OM-1 with establishment of TIMI III flow in this  graft with normalization of ST. Angiogram showed 20-30% proximal stenosis  and then 99% mid-graft stenosis with thrombus. Next, the Broxus proximal  protection device was advanced under fluoroscopic guidance up to the proximal saphenous vein  graft to OM-1. A few cc's of dye was injected to  confirm the complete occlusion of the distal vascular bed. Next, a 3.5 x 16  mm long Taxus drug-eluting stent was advanced over the wire under  fluoroscopic guidance up to the midportion of the graft. The stent was  expanded. The stent was deployed at 10 atmospheres of pressure which was  fully expanded going up to 15 atmospheres of pressure using proximal  protection with Broxus catheter as above. Lesion was dilated from 100% to 0%  residual. Stent delivery system was pulled out and then aspiration was done  through the Broxus catheter with minimal collection of the atherosclerotic  disease. Angiogram post Broxus catheter removal and showed excellent TIMI  grade III distal flow without evidence of dissection or distal embolization.  The patient received weight-based heparin, Integrilin, and 600 milligrams of  Plavix during the procedure. The patient tolerated procedure well. The  patient's ST normalized and remained  normalized at the end of the procedure. The patient's chest pain was  completely resolved at the end of the procedure. ACT was maintained around  275 during the procedure. The patient tolerated procedure well. There were  no complications. The patient was transferred to recovery room in stable  condition.           ______________________________  Eduardo Osier Sharyn Lull, M.D.     M[facility]/MEDQ  D:  12/05/2004  T:  12/06/2004  Job:  [id number]   cc:   Osvaldo Shipper. Spruill, M.D.  Fax: [phone]   Catheter Lab

## 2010-06-04 NOTE — Cardiovascular Report (Signed)
[place]. [facility]  Patient:    [name], [name] Visit Number: [id number] MRN: [id number]          Service Type: CAT Location: 4700 [id number] Attending Physician:  Nathen May Dictated by:   Nathen May, M.D., [facility] Proc. Date: 04/30/01 Admit Date:  04/30/2001                          Cardiac Catheterization  PREOPERATIVE DIAGNOSIS:  Atrial flutter.  POSTOPERATIVE DIAGNOSIS:  Atrial flutter.  PROCEDURE:  Electrophysiologic study with radiofrequency catheter ablation and electronic anatomical mapping.  DESCRIPTION OF PROCEDURE:  Following the obtaining of informed consent, the patient was brought to the electrophysiological laboratory and sedated under general anesthesia (under the care of David A. Ivin Booty, M.D.).  Following intubation, the patient was prepped first for cardiac catheterization. Following the procedure was removed, hemostasis was obtained and the patient was transferred to the PACU in stable condition.  A 6-French octipolar catheter was inserted via the left femoral vein to the coronary sinus os and His Bundle.  A 7-French duadecapolar catheter was inserted via the right femoral vein to the tricuspid annulus.  A 9-French ES2000 and a cardial ray was inserted via the left femoral vein to mapping sites in the right atrium.  An 8-French 4 mm _____________ catheter was inserted via the right femoral vein through the source at the greater ACT (SR0) and the _____________ ablation sites in the tricuspid annulus IVC isthmus.  Surface leads I, aVF were monitored throughout the procedure.  Following the insertion of the strut catheters, the stimulation protocol included: 1) Incremental atrial pacing, 2) Single extrastimuli at a paced cycle length of 600 msec, 3) Incremental ventricular pacing.  RESULTS:  SURFACE ELECTROCARDIOGRAM: 1. Rhythm sinus. 2. Cycle length 845 msec. 3. P-R Interval 174 msec. 4. Q-R Interval 82  msec. 5. Q-T Interval 436 msec. 6. P-wave duration 132 msec. 7. Bundle branch block absent. 8. Pre-excitation absent.  Final measurements appeared to be normal and were no recorded at this time.  1. A-H Interval 92 msec. 2. AV Wenckebach 340 msec. 3. AV Nodal effector refractory 600 msec at 260 msec, with discontinuity,    but without a jump.  HIS PURKINJE SYSTEM FUNCTION:  H-V Interval 15 msec.  ACCESSORY PATHWAY FUNCTION:  No accessory pathway was identified.  ARRHYTHMIA:  No arrhythmias were induced.  The patient was mapped and ablated to sinus rhythm, with pacing on the coronary sinus os used to confirm bidirectional block.  FLUOROSCOPY TIME:  A total of 35 min of fluoroscopy was utilized at 15 frames/sec.  RADIOFREQUENCY ENERGY:  A total of 8 min 13 sec of radiowave energy was applied in a total of 17 ablation lesions applied between the tricuspid annulus and the inferior vena cava, at approximately 5:30 to 6:00 on the tricuspid annulus isthmus.  Ultimately virtual electrograms from the [facility] and color mapping from the [facility] all confirmed bidirectional block.  IMPRESSION: 1. Normal sinus function. 2. Abnormal atrial function, with sustained atrial flutter.  The patient    underwent RF catheter ablation of the tricuspid annulus IVC isthmus    with triple confirmation of bidirectional block. 3. Discontinuous A-V nodal conduction, without evidence of echo beats. 4. Normal His Purkinje system function. 5. No accessory pathway. 6. Normal ventricular response to programmed stimulation, as described above.  SUMMARY AND CONCLUSION:  Results of electrophysiologic testing identified conduction across the tricuspid annulus isthmus.  Flutter was not induced as part of the procedure.  The conduction across the isthmus was eliminated with radiofrequency energy.  RECOMMENDATION:  The patient will be maintained on aspirin; Coumadin will be discontinued.  Endocarditis prophylaxis for  three months. Dictated by:   Nathen May, M.D., [facility] Attending Physician:  Nathen May DD:  04/30/01 TD:  05/01/01 Job: [id number]

## 2010-06-04 NOTE — H&P (Signed)
[place]. [facility]  Patient:    [name], [name] Visit Number: [id number] MRN: [id number]          Service Type: MED Location: 2000 2004 01 Attending Physician:  [name] Dictated by:   [name], M.D. [facility] Admit Date:  03/26/2001   CC:         [name], M.D.  [place]   History and Physical  CHIEF COMPLAINT:  Shortness of breath with exertion.  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  He has a history of coronary bypass surgery in 1990 and underwent catheterization in 2000, at which time all his grafts were patent and he had an ejection fraction of 64%.  Over the last several months, he has had symptoms of dyspnea on exertion and fatigue and some feeling of his heart pounding.  He saw Dr. Butch Richards, who did an EKG and found him to be in atrial flutter, and transferred him here for admission.  He says he has had chest pain but when asked specifically about this, it sounds like he is describing the flutter feeling in his heart.  His symptoms are not similar to his previous MI symptoms.  He has had no fever, cough or edema.  PAST MEDICAL HISTORY:  His past medical history is significant for gout, previous neck fracture, hypertension and hyperlipidemia and tobacco use.  He has no history of diabetes.  For social history, family history and review of systems, please see a complete note by [name], P.A.  PHYSICAL EXAMINATION:  VITAL SIGNS:  The blood pressure was 134/80 and the pulse 122 and regular and respiratory rate was 20.  SKIN:  Warm and dry.  HEENT:  The pupils were equal and round and extraocular movements were full. There was no venous distention.  NECK:  The carotid pulses are full without bruits.  The thyroid is not enlarged.  CHEST:  Clear without rales or rhonchi.  CARDIAC:  Cardiac rhythm was regular.  The heart sounds were normal.   I could hear no murmurs or gallops.  ABDOMEN:  Soft without organomegaly or hepatosplenomegaly and with good bowel sounds.  EXTREMITIES:  No edema.  The pedal pulses were full.  MUSCULOSKELETAL:  There were no deformities.  NEUROLOGIC:  Examination was nonfocal.  LABORATORY AND ACCESSORY DATA:  A rhythm strip showed atrial flutter with 2:1 block with a ventricular rate of 122.  IMPRESSION: 1. Atrial flutter -- of probable several months duration. 2. Status post coronary bypass graft surgery in 1990 with patent grafts in    2000. 3. Good left ventricular function. 4. Cigarette use.  RECOMMENDATIONS:  I think probably the best option for treatment of [name] atrial flutter is TEE-guided flutter ablation.  We will plan to start the patient on Coumadin and heparin and ask our electrophysiologist to see him in the morning. Dictated by:   [name], M.D. [facility] Attending Physician:  [name] DD:  03/26/01 TD:  03/27/01 Job: [id number]

## 2010-06-04 NOTE — Cardiovascular Report (Signed)
NAMEMURLIN, SCHRIEBER                 ACCOUNT NO.:  [id number]   MEDICAL RECORD NO.:  [id number]          PATIENT TYPE:  OIB   LOCATION:  6529                         FACILITY:  [facility]   PHYSICIAN:  Mohan N. Sharyn Lull, M.D. DATE OF BIRTH:  1950/04/09   DATE OF PROCEDURE:  12/23/2004  DATE OF DISCHARGE:                              CARDIAC CATHETERIZATION   PROCEDURE:  1.  Left cardiac cath with selective left coronary angiography,      visualization of saphenous vein graft to OM-1 via right groin using      Judkins technique.  2.  Successful PTCA to ostial left circumflex and distal left main using      initially 2.0 x 12 mm long, then 3.0 x 12 mm long balloon.  3.  Successful deployment of 3.0 x 16 mm long Taxus drug-eluting stent in      ostial left circumflex and distal left main. Successful post dilatation      of this stent using 4.0 x 8 mm long PowerSail balloon in left main.  4.  Intravascular ultrasound of left circumflex and left main prior to stent      deployment and then poststent dilatation at the end of the procedure.   INDICATIONS FOR PROCEDURE:  Mr. Gullo is a 60 year old black male with  past medical history significant for coronary artery disease status post  recent inferoposterior lateral wall MI, status post PTCA stenting to the  saphenous vein graft to OM-1 using proximal protection device, history of MI  in 1989, status post CABG. He had LIMA to LAD and saphenous vein graft to OM-  1. Left circumflex was not bypassed and that time. The patient also has  history of hypertension, hypercholesteremia, history of gouty arthritis,  tobacco abuse. He is admitted for elective PCI to protected distal left main  and ostial left circumflex which was not bypassed as above. The patient was  recently discharged from the hospital. Denies any chest pain, shortness of  breath, nausea, vomiting, diaphoresis, although his activity is very  limited. Denies palpitation, lightheadedness  or syncope. Denies PND,  orthopnea, leg swelling.   PAST MEDICAL HISTORY:  As above.   PAST SURGICAL HISTORY:  He had coronary artery bypass grafting x2 in 1989 as  above. Has jaw and neck fracture many years ago.   ALLERGIES:  No known drug allergies.   MEDICATIONS:  At home was on Nitro-Dur 0.4 milligrams per hour daily,  Lopressor 50 milligrams p.o. b.i.d., Plavix 75 milligrams p.o. daily,  enteric-coated aspirin 325 milligrams p.o. daily, Lipitor 40 milligrams p.o.  daily, Altace 2.5 milligrams p.o. daily, allopurinol 300 milligrams p.o.  daily, colchicine 0.6 milligrams p.o. daily.   SOCIAL HISTORY:  He is married and has four children. Smoked one pack per  day for 20+ years, quit recently. Drinks occasionally whiskey.   FAMILY HISTORY:  Noncontributory.   EXAMINATION:  He is alert and oriented x3 in no acute distress. Blood  pressure was 130/70, pulse was 66 regular. Conjunctivae was pink. Neck was  supple. No JVD, no bruit. Lungs were clear to auscultation  without rhonchi  or rales. Cardiovascular exam showed S1 and S2 was normal. There was soft  systolic murmur. There was no S3 gallop. Abdomen was soft. Bowel sounds  present, nontender. Extremities show there is no clubbing, cyanosis or  edema.   IMPRESSION:  1.  Stable angina, coronary artery disease status post recent      inferoposterior wall myocardial infarction, status post percutaneous      coronary intervention to the graft to obtuse marginal-1.  2.  Coronary artery disease.  3.  Hypertension.  4.  Hypercholesteremia.  5.  History of gouty arthritis.   Discussed with the patient regarding PTCA stenting to distal left main and  ostial left circumflex, its risks and benefits i.e. death, MI, stroke, need  for emergency CABG, risk of restenosis, local vascular complications. Accept  and consented for the procedure.   PROCEDURE:  After obtaining informed consent, the patient was brought to the  cath lab and was  placed on fluoroscopy table. The right groin was prepped  and draped in usual fashion. 2% Xylocaine was used for local anesthesia in  the right groin. With the help of thin-wall needle, a 7-French arterial  sheath was placed. The sheath was aspirated and flushed. Next, a 6-French  left bypass graft diagnostic catheter was advanced over the wire under  fluoroscopic guidance into the ascending aorta. Wire was pulled out, the  catheter was aspirated and connected to the manifold. Catheter was further  advanced and engaged into left coronary ostium. Catheter was engaged into  saphenous vein graft to OM-1. Multiple views of this graft were taken. Next,  the catheter was disengaged and was pulled out over the wire and was  replaced with 7-French 3.0 Voda guiding catheter which was advanced over the  wire under fluoroscopic guidance into the ascending aorta. Wire was pulled  out, the catheter was aspirated and connected to the manifold. Catheter was  further advanced and engaged into left coronary ostium. Multiple views of  the 3.0 Voda guiding catheter was advanced over the wire under fluoroscopic  guidance into the ascending aorta. Catheter was further advanced and engaged  into left coronary ostium. Multiple views of the left system were obtained.   FINDINGS:  LV was not done. Left main had distal 90% stenosis. LAD was 100%  occluded. Ramus was very small. Left circumflex has ostial 70-75% stenosis.  Saphenous vein graft to OM-1 was patent at prior PTCA and stented site with  TIMI grade 3 distal flow.   INTERVENTIONAL PROCEDURE:  Successful PTCA to distal protected left main and  ostial left circumflex was done using initially 2.0 x 12 mm long Voyager  balloon for predilatation and then intravascular ultrasound was done which  showed 3 mm left circumflex vessel and 4.5 mm ostial left main with 90% distal left main and ostial circumflex stenosis and then PTCA to distal left  main and ostial  left circumflex done using 3.0 x 12 mm long Voyager balloon  for predilatation and then 3.0 x 16 mm long Taxus drug-eluting stent was  deployed at 9 atmospheres of pressure which was fully expanded going up to  13 atmospheres of pressure. Then 4.8 x 8 mm long PowerSail balloon was used  going up to 13 atmospheres of pressure and left main lesion dilated from 90%  to 0% residual with excellent TIMI grade III distal flow without evidence of  dissection or distal embolization. Intravascular ultrasound was done at the  end of the procedure which  showed full expansion of this stent with well  position to the wall in circumflex and left main. The patient tolerated  procedure well. There were no complications.  The patient received weight-based heparin, Integrilin and 300 milligrams of  Plavix during the procedure. The patient tolerated procedure well. There  were no complications. The patient was transferred to recovery room in  stable condition.           ______________________________  Eduardo Osier Sharyn Lull, M.D.     MNH/MEDQ  D:  12/23/2004  T:  12/24/2004  Job:  [id number]   cc:   Osvaldo Shipper. Spruill, M.D.  Fax: [phone]   Catheter Lab

## 2010-06-04 NOTE — Discharge Summary (Signed)
[name], [name] NO.:  [id number]   MEDICAL RECORD NO.:  [id number]          PATIENT TYPE:  INP   LOCATION:  2908                         FACILITY:  [facility]   PHYSICIAN:  Osvaldo Shipper. Spruill, M.D.DATE OF BIRTH:  11-23-1950   DATE OF ADMISSION:  12/05/2004  DATE OF DISCHARGE:  12/08/2004                                 DISCHARGE SUMMARY   DISCHARGE DIAGNOSIS:  1.  Acute myocardial infarction (inferior posterior).  2.  Coronary atherosclerosis.  3.  Aortocoronary bypass.  4.  Hypertension.  5.  Tobacco use.   PROCEDURES:  Percutaneous transluminal coronary angiography and left heart  catheterization, Dr. Rinaldo Cloud, cardiac stent by Dr. Sharyn Lull on December 05, 2004.   HOSPITAL COURSE:  [name] is a 60 year old patient who presented to the  emergency department at [facility] in a private vehicle with  complaint of chest pain.  The patient gave a 24 hour history of pain in the  mid chest with radiation to the arms.  The electrocardiogram was read as  abnormal, a code was initiated in the emergency department, and the patient  was subsequently admitted for an acute inferior myocardial infarction.  The  patient was taken immediately to the cardiac catheterization laboratory by  Dr. Sharyn Lull where cardiac catheterization revealed the left ventricle to  show inferior hypokinesis and 2+ mitral regurgitation, ejection fraction 50-  55%.  The left main showed an 80-90% distal stenosis, the left anterior  descending  showed a 100% occlusion, the ramus showed less than 1 mm diffuse  disease, the circumflex had an 80-85% ostial stenosis, the right coronary  artery showed a 20-30% mid stenosis, the left internal mammary artery to the  LAD was patent.  The saphenous vein graft to the first obtuse marginal was  100% occluded in the mid portion with no flow, this was successfully  stented.  The patient was treated in the intensive care unit.  The patient  was  seen by the tobacco cessation team for assistance with his tobacco  issues.  On November 21, the electrocardiogram showed normal sinus rhythm  with evolving ST-T wave changes in the inferior posterior leads.  The  patient was initiated in cardiac rehab phase 1.  Plans were made for the  patient to be discharged home.   DISCHARGE MEDICATIONS:  Aspirin 81 mg daily, Albuterol 2 puffs q.i.d.,  Altace 2.5 mg daily.   DISCHARGE INSTRUCTIONS:  The patient is to be seen in the office in one  week, sooner for any changes, problems or concerns.      [name], P.A.      Osvaldo Shipper. Spruill, M.D.  Electronically Signed    HB/MEDQ  D:  01/31/2005  T:  01/31/2005  Job:  [id number]

## 2010-06-04 NOTE — Cardiovascular Report (Signed)
NAMEBEKIM, WERNTZ                 ACCOUNT NO.:  [id number]   MEDICAL RECORD NO.:  [id number]          PATIENT TYPE:  INP   LOCATION:  6527                         FACILITY:  [facility]   PHYSICIAN:  Mohan N. Sharyn Lull, M.D. DATE OF BIRTH:  [birth year]   DATE OF PROCEDURE:  05/04/2006  DATE OF DISCHARGE:  05/05/2006                            CARDIAC CATHETERIZATION   PROCEDURE:  Successful direct stenting of saphenous vein graft to obtuse  marginal #1 using 3.5 x 18 mm long Cypher drug-eluting stent.  1. Successful post dilatation of Cypher drug-eluting stent using 3.75      x 13 mm long PowerSail balloon.   INDICATIONS FOR PROCEDURE:  Mr. Marlette  in her is 60 year old black  male with past medical history significant for coronary artery disease,  history of inferoposterolateral wall MI in November 2006, status post  PCI to saphenous vein graft to OM-1, history of MI in 1989 status post  CABG. He had LIMA to LAD and saphenous vein graft to OM-1, status post  PTCA stenting to protected distal left main in December 2006,  hypertension, hypercholesteremia, gouty arthritis, tobacco abuse.  Complains of retrosternal chest pain off and on without associated  symptoms of nausea, vomiting, diaphoresis. Chest pain relieved with  sublingual nitroglycerin.  Denies any shortness of breath.  Denies  palpitation, lightheadedness or syncope.  Denies rest or nocturnal  angina.  The patient underwent Persantine Myoview on March 29, 2006,  which showed inferolateral wall scar with mild anterolateral and lateral  wall ischemia with EF of 52%.  The patient subsequently underwent left  catheterization as outpatient which showed critical stenosis in  saphenous vein graft to OM-1. The patient is admitted for PCI to  saphenous vein graft to OM-1. Discussed with the patient at length  regarding left catheterization findings and PTCA stenting to saphenous  vein graft to OM-1, its risks and benefits; i.e., death,   MI, stroke,  need for emergency CABG, risk of restenosis, local vascular  complications, risk of distal embolization in the range of 15-20%  leading to MI and consented for PCI.   DESCRIPTION OF PROCEDURE:  After obtaining informed consent, the patient  was brought to the catheterization lab and was placed on fluoroscopy  table.  Right groin was prepped and draped in usual fashion; 2%  Xylocaine was used for local anesthesia in the right groin. With the  help of thin-wall needle, a 6-French arterial sheath was placed.  The  sheath was aspirated and flushed.  Next, a 6-French AL-1 Amplatz One  guiding catheter was advanced over the wire under fluoroscopic guidance  to the ascending aorta.  Wire was pulled out. The catheter was aspirated  and connected to the manifold.  Catheter was further advanced and  engaged into saphenous vein graft to OM-1. Multiple views of this graft  were taken.   FINDINGS:  The patient had patent proximal and mid portion stents.  There is 90% proximal stenosis proximal to the proximal stent with TIMI-  3 flow.  The patient also has 50-60% distal stenosis right at the  insertion of  the graft and the bifurcation.   INTERVENTIONAL PROCEDURE:  Successful direct stenting to proximal  saphenous vein graft to OM-1 was done using distal protection filter  wire using 3.5 x 18 mm long Cypher drug-eluting stent in the proximal  portion going up to 15 atmospheres pressure.  The stent was postdilated  using 3.75 x 13 mm long PowerSail balloon going up to 20 atmospheres  pressure.  The lesion was dilated from 90% to less than 10% residual  stenosis with excellent  TIMI grade 3 distal flow without evidence of dissection or distal  embolization using EZ filter wire for distal protection as stated above.  The patient tolerated procedure well.  There were no complications.  The  patient was transferred to recovery room in stable condition.            ______________________________  Eduardo Osier Sharyn Lull, M.D.     MNH/MEDQ  D:  05/05/2006  T:  05/05/2006  Job:  [id number]   cc:   Cardiac catheterization lab  Osvaldo Shipper. Spruill, M.D.
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
## 2010-06-04 NOTE — Cardiovascular Report (Signed)
NAMEJOSEARMANDO, KUHNERT                 ACCOUNT NO.:  [id number]   MEDICAL RECORD NO.:  [id number]          PATIENT TYPE:  OIB   LOCATION:  NA                           FACILITY:  [facility]   PHYSICIAN:  Mohan N. Sharyn Lull, M.D. DATE OF BIRTH:  July 02, 1950   DATE OF PROCEDURE:  04/25/2006  DATE OF DISCHARGE:                            CARDIAC CATHETERIZATION   PROCEDURE:  Left cardiac catheterization with selective left and right  coronary angiography, visualization of saphenous vein graft, left  internal mammary artery graft, left ventriculography via right groin  using Judkins technique.   INDICATIONS FOR PROCEDURE:  Mr. Rinker is a 60 year old black male with  past medical history significant for coronary artery disease.  History  of inferoposterolateral wall MI in November of 2006 status post  emergency PCI to saphenous vein graft to OM-1, history of MI in 1989  status post CABG.  He had LIMA to LAD and saphenous vein graft to OM-1.  Status post PTCA and stenting to protected distal left main in December  of 2006, hypertension, hypercholesteremia, gouty arthritis, tobacco  abuse.  Complains of retrosternal chest pain off and on without  associated symptoms of nausea, vomiting, diaphoresis, chest pain,  relieved with sublingual nitro.  Denies any shortness of breath.  Denies  palpitation, lightheadedness or syncope.  Denies rest or nocturnal  angina.  The patient underwent Persantine Myoview on April 30, 2006  which showed inferolateral wall scar with mild anterior and lateral wall  ischemia with EF of 52%.   PAST MEDICAL HISTORY:  As above.   PAST SURGICAL HISTORY:  He has CABG in 1989 as above.  Had history of  jaw and neck fracture many years ago.   ALLERGIES:  No known drug allergies.   MEDICATIONS:  At home, he is on:  1. Toprol-XL 50 mg p.o. b.i.d.  2. Altace 10 mg p.o. daily.  3. Lipitor 40 mg p.o. daily.  4. Enteric-coated aspirin 325 mg p.o. daily.  5. Plavix 75 mg p.o.  daily which he recently stopped due to mild      epistaxis.   SOCIAL HISTORY:  He is married.  He has four children.  Smoked 1 pack  per day for 40+ years.  Now smokes half pack per day.  Drinks whiskey  occasionally.   FAMILY HISTORY:  Is noncontributory,   On examination, he was awake, alert and oriented x3 in no acute  distress.  Blood pressure was 164/90, pulse was 78 and regular.  CONJUNCTIVAE:  Was pink.  NECK:  Supple.  No JVD, no bruit.  LUNGS:  Were clear to auscultation without rhonchi or rales.  CARDIOVASCULAR EXAM:  S1/S2 was normal.  There was a soft systolic  murmur.  There was no S3 gallop.  ABDOMEN:  Was soft.  Bowel sounds were present, nontender.  EXTREMITIES:  There was no clubbing, cyanosis or edema.   IMPRESSION:  Was accelerated angina, positive Persantine Myoview, rule  out restenosis versus progression of the disease, uncontrolled  hypertension, hypercholesteremia, coronary artery disease, history of  myocardial infarction x2 in the past, status post coronary  artery bypass  grafting, tobacco abuse.  Discussed with the patient regarding left  catheterization, its risks and benefits; i.e. death, MI, stroke, need  for emergency coronary artery bypass grafting, risk of restenosis, local  vascular complications.  Accepted and consented for the procedure.   PROCEDURE:  After obtaining informed consent, the patient was brought to  the catheterization lab and was placed on fluoroscopy table.  The right  groin was prepped and draped in usual fashion.  Two percent Xylocaine  was used for local anesthesia in the right groin.  With the help of a  thin-wall needle, a 4-French arterial sheath was placed.  The sheath was  aspirated and flushed.  Next, 4-French left Judkins catheter was  advanced over the wire under fluoroscopic guidance up to the ascending  aorta.  Wire was pulled out.  The catheter was aspirated and connected  to the manifold.  Catheter was further  advanced and engaged into left  coronary ostium.  Multiple views of the left system were taken.  Next,  the catheter was disengaged and was pulled out over the wire and was  replaced with 4-French 3-D right diagnostic catheter which was advanced  over the wire under fluoroscopic guidance into the ascending aorta.  Wire was pulled out.  The catheter was aspirated and connected to the  manifold.  Catheter was further advanced and engaged into right coronary  ostium.  Multiple views of the right system were taken.  Next, the  catheter was disengaged and was engaged into saphenous vein graft to OM-  1.  Multiple views of this graft were taken.  Next, the catheter was  disengaged and was pulled out over the wire and was placed with 4-French  LIMA diagnostic catheter which was advanced over the wire under  fluoroscopic guidance into the ascending aorta.  Catheter was further  advanced into the left subclavian artery over the wire.  Next, catheter  was aspirated and connected to the manifold.  Catheter was pulled back  and engaged into LIMA to LAD.  Multiple views of this graft were taken.  Next, the LIMA diagnostic catheter was pulled out over the wire and was  replaced with a 6-French pigtail catheter which was advanced over the  wire under fluoroscopic guidance to the ascending aorta.  Catheter was  further advanced across aortic valve into LV.  LV pressures were  recorded.  Next, left ventriculography was done in 30-degree RAO  position.  Post-angiographic pressures were recorded from LV, and then  pullback pressures were recorded from the aorta.  There was no gradient  across aortic valve.  Next, the pigtail catheter was pulled out over the  wire.  Sheaths were aspirated and flushed.   FINDINGS:  LV showed good LV systolic function, EF of approximately 50%.  Left main has 10-15% in-stent restenosis.  LAD was 100% occluded beyond the ostium as before.  Ramus has 80-85% ostial stenosis.  The  vessel is  very small which is not suitable for PCI.  Left circumflex has 10-20%  mid stenosis.  OM1 was 100% occluded.  RCA has 15-20% proximal,  mid and  distal stenosis.  PDA was small which was patent.  PLV branches were  small which were patent.  Saphenous vein graft to OM-1 has 80% proximal  stenosis.  Stented midportion is patent and has 60% distal bifurcation  stenosis.  LIMA to LAD has apical 50-60% stenosis.  The patient  tolerated the procedure well.  There were no  complications.   Plan is to restart his Plavix.  Toprol XL has been increased to 50 mg  twice daily.  The patient and his wife have discussed at length  regarding catheterization finding and PCI to saphenous vein graft to OM-  1, its risks, i.e. death,  stroke, need for emergency CABG, risk of distal embolization, risk of  restenosis, etc., and consented for the procedure.  The patient has been  scheduled for PCI to saphenous vein graft to OM-1 early next week.  The  patient has been advised to restart his Plavix.  If he gets any  recurrent chest pain, he should call 9-1-1 and come to the ER.           ______________________________  Eduardo Osier. Sharyn Lull, M.D.     MNH/MEDQ  D:  04/25/2006  T:  04/25/2006  Job:  [id number]   cc:   Osvaldo Shipper. Spruill, M.D.  Eduardo Osier. Sharyn Lull, M.D.  Catheterization Laboratory

## 2010-06-04 NOTE — Discharge Summary (Signed)
NAMECHRISTOBAL, [name]                 ACCOUNT NO.:  [id number]   MEDICAL RECORD NO.:  [id number]          PATIENT TYPE:  INP   LOCATION:  6527                         FACILITY:  [facility]   PHYSICIAN:  Mohan N. Sharyn Lull, M.D. DATE OF BIRTH:  1950-10-22   DATE OF ADMISSION:  05/04/2006  DATE OF DISCHARGE:  05/05/2006                               DISCHARGE SUMMARY   ADMITTING DIAGNOSES:  1. Accelerated angina.  2. Positive Persantine Myoview.  3. Status post recent catheterization.  4. Uncontrolled hypertension.  5. Hypercholesteremia.  6. Coronary artery disease.  7. History of myocardial infarction x2.  8. Status post coronary artery bypass graft.  9. Tobacco abuse.   FINAL DIAGNOSES:  1. Stable angina, status post percutaneous transluminal coronary      angioplasty stenting to saphenous vein graft to obtuse marginal-1.  2. Hypertension.  3. Hypercholesteremia.  4. Coronary artery disease.  5. History of myocardial infarction x2 in the past.  6. Status post coronary artery bypass graft.  7. History of tobacco abuse.   DISCHARGE MEDICATIONS:  1. Enteric-coated aspirin 325 mg, one tablet daily.  2. Plavix 75 mg, one tablet daily with food.  3. Toprol XL 50 mg, one tablet twice daily.  4. Altace 10 mg, one capsule daily.  5. Lipitor 40 mg, one tablet daily.  6. Nitrolingual spray, use as directed.   DIET:  Low salt, low cholesterol.   The patient has been advised to avoid any lifting, pushing, driving, or  sexual activity for 1 week.  Post PTCA stent instructions have been given.  The patient has been advised to stop smoking, which he agrees.   Follow up with me in 1 week and Dr. Shana Chute as scheduled.   CONDITION AT DISCHARGE:  Stable.   BRIEF HISTORY AND HOSPITAL COURSE:  [name], [name] is a 60 year old  black male with past medical history significant for coronary artery  disease, history of inferoposterolateral wall MI in November 2006,  status post emergency PCI to  saphenous vein graft to OM-1, history of MI  in 1989, status post CABG.  He had LIMA to LAD, and saphenous vein graft  to OM-1.  History of MI.  History of status post PTCA stenting to  protect the distal left main in December 2006, hypertension,  hypercholesteremia, history of gouty arthritis, tobacco abuse.  He  complains of retrosternal chest pain off and on without associated  symptoms of nausea, vomiting, diaphoresis.  Chest pain relieves with  sublingual nitro.  He denies any shortness of breath.  He denies  palpitation, lightheadedness, or syncope.  He denies rest or nocturnal  angina.  The patient underwent Persantine Myoview on March 29, 2006,  which showed inferolateral wall scar with mild anterior and lateral wall  ischemia with EF of 52%.   PAST MEDICAL HISTORY:  As above.   PAST SURGICAL HISTORY:  1. CABG 1989, as above.  2. History of jaw and neck fracture many years ago.   ALLERGIES:  NO KNOWN DRUG ALLERGIES.   HOME MEDICATIONS:  1. Toprol XL 50 mg twice daily.  2.  Altace 10 mg p.o. daily.  3. Lipitor 40 mg p.o. daily.  4. Enteric-coated aspirin 325 mg p.o. daily.  5. Plavix has been recently started 75 mg p.o. daily.   SOCIAL HISTORY:  He is married, four children, smokes one-pack per day  for 40 years, and now smokes half pack per day, drinks whiskey  occasionally socially.   FAMILY HISTORY:  Noncontributory.   PHYSICAL EXAMINATION:  VITAL SIGNS:  His blood pressure was 164/90,  pulse was 78 regular.  HEENT:  Conjunctivae was pink.  NECK:  Supple, no JVD, no bruit.  LUNGS:  Clear to auscultation without rhonchi or rales.  CARDIOVASCULAR: S1 and S2 were normal.  There was a soft systolic  murmur.  There was no S3 gallop.  ABDOMEN:  Soft.  Bowel sounds present, nontender.  EXTREMITIES:  There is no clubbing, cyanosis, or edema.   The patient recently had left cath which showed critical stenosis in  saphenous vein graft to OM-1.  The patient was outpatient  admit and was  admitted for PCI to saphenous vein graft to OM-1, which the patient  underwent yesterday successfully without any complications as per  procedure report.  The patient tolerated procedure well.  The patient  did not have any episodes of chest pain during the hospital stay.   Post procedure, his hemoglobin/hematocrit have been stable.  Platelet  count has been stable.  His post procedure CPK is 115, MB 1.4.  His  renal function has been stable.  His groin is stable with no evidence of  hematoma or bruit.   Cardiac rehab was called.  The patient has been ambulating in hallway  without any problems.  The patient states he is not going to smoke  anymore.  The patient also was consulted for phase II outpatient rehab  to which he voiced understanding but not interested to join the phase II  cardiac rehab at this point.   The patient will be discharged home on above medications and will be  followed up in my office in 1 week.  Dr. Shana Chute as scheduled.           ______________________________  Eduardo Osier Sharyn Lull, M.D.     MNH/MEDQ  D:  05/05/2006  T:  05/05/2006  Job:  [id number]   cc:   Osvaldo Shipper. Spruill, M.D.

## 2010-06-04 NOTE — Consult Note (Signed)
[place]  Patient:    CODI, KERTZ Visit Number: [id number] MRN: [id number]          Service Type: OUT Location: RAD Attending Physician:  Alice Reichert Dictated by:   Nathen May, M.D., [facility] Proc. Date: 03/28/01 Admit Date:  03/26/2001 Discharge Date: 03/26/2001   CC:         Butch Penny, M.D.  [facility]   Consultation Report  Thank you very much for asking me to see Mr. Lacey Jensen in electrophysiology consultation for atrial fibrillation with 2:1 block.  Mr. Giampietro is a 60 year old gentleman who underwent bypass surgery at the age of 46 and catheterization most recently in 2000 demonstrated patent grafts and normal left ventricular function.  Over the last five months he has had problems with palpitations and exercise intolerance and occasional peripheral edema.  He saw Dr. Renard Matter earlier this week who took electrocardiogram that noted atrial flutter and referred for further evaluation.  The patient denies presyncope.  He also denies transient neurologic episodes.  PAST MEDICAL HISTORY:  Notable for hypertension, as noted.  He has not had diabetes.  He has gout.  SOCIAL HISTORY:  He does smoke.  He is married.  He has four children.  He works as a Armed forces technical officer.  He drinks a couple of beers a day and uses caffeine daily.  He does not use recreational drugs or herbal medications.  FAMILY HISTORY:  Noncontributory.  REVIEW OF SYSTEMS:  Noted on the intake sheet and is positive primarily as above.  He also has had headaches, sweats, and diaphoresis.  MEDICATIONS:  It is notable that he was not taking a lipid lowering agent on arrival, but is now on Zocor.  PHYSICAL EXAMINATION  GENERAL:  He is a well-developed, well-nourished middle to older aged African-American male in no acute distress.  VITAL SIGNS:  Blood pressure 128/80, pulse 120 and regular.  HEENT:  No  scleral icterus or xanthomata.  NECK:  Veins were flat.  Carotids were brisk and full bilaterally without bruits.  BACK:  Without kyphosis, scoliosis.  LUNGS:  Clear.  HEART:  Demonstrated a regular rhythm that was quite fast.  There was a 2/6 systolic murmur.  No gallops were noted.  ABDOMEN:  Soft with active bowel sounds without midline pulsation or hepatomegaly.  EXTREMITIES:  Femoral pulses were 2+.  Distal pulses were intact.  There was no clubbing, cyanosis, edema.  NEUROLOGIC:  Grossly normal.  LABORATORIES:  Electrocardiogram demonstrates atrial flutter 2:1 block with an atrial cycle length of 230-40 milliseconds and atypical saw tooth pattern.  IMPRESSION: 1. Atrial flutter, typical with 2:1 AV block. 2. Dyspnea on exertion secondary to #1. 3. Thromboembolic risk factors positive for hypertension. 4. Coronary artery disease.    a. Status post coronary artery bypass graft.    b. Normal left ventricular function.    c. Catheterization okay in 2000. 5. Hyperlipidemia. 6. Ongoing cigarette use.  DISCUSSION:  Mr. Smolinsky has atrial flutter and symptoms related to it. Thromboembolic risk factors with his hypertension.  Based on that he would need to be on Coumadin long-term.  We have discussed potential risks of Coumadin and the potential stroke issues compared to the potential benefits and the risks of heart catheter ablation which I have quoted him is approximately 1 in 1000.  We have talked about the benefits which are of a 95%+ chance of elimination of atrial flutter and a 0-30%  risk of recurrence as well as the development of atrial fibrillation.  There is 1 in 100 risk of iatrogenic heart block requiring pacemaker implantation.  He understands these risks and based on the options he would like to proceed with definitive treatment for his atrial flutter.  However, the schedule does not permit flutter ablation this week so we will undertake TEE guided  cardioversion with elective flutter ablation to be scheduled in approximately four weeks time. Dictated by:   Nathen May, M.D., [facility] Attending Physician:  Alice Reichert DD:  03/28/01 TD:  03/29/01 Job: [id number]

## 2010-06-04 NOTE — Discharge Summary (Signed)
[name], [name]                 ACCOUNT NO.:  [id number]   MEDICAL RECORD NO.:  [id number]          PATIENT TYPE:  OIB   LOCATION:  6529                         FACILITY:  [facility]   PHYSICIAN:  Mohan N. Sharyn Lull, M.D. DATE OF BIRTH:  05-May-1950   DATE OF ADMISSION:  12/23/2004  DATE OF DISCHARGE:  12/24/2004                                 DISCHARGE SUMMARY   ADMITTING DIAGNOSES:  1.  Coronary artery disease.  2.  Stable angina.  3.  Status post recent and focal posterolateral wall myocardial infarction.  4.  Status post percutaneous coronary intervention to 100% occluded      saphenous vein graft to the obtuse marginal-1 approximately two weeks      ago.  5.  Two-vessel coronary artery disease.  6.  hypertension,  7.  Hypercholesterolemia.  8.  History of gouty arthritis.   DISCHARGE DIAGNOSES:  1.  Coronary artery disease status post percutaneous transluminal coronary      angioplasty and stenting to the distal-protected left main and ostial      left circumflex.  2.  Coronary artery disease status post recent and focal posterolateral      myocardial infarction status post percutaneous coronary intervention      with saphenous vein graft to the obtuse marginal-1.  3.  Two-vessel coronary artery disease.  4.  Hypertension.  5.  Hypercholesterolemia.  6.  History of gouty arthritis.  7.  Anemia secondary to blood loss during the procedure and hydration.   DISCHARGE MEDICATIONS:  The home medications on discharge are to be:  1.  Toprol XL 50 mg 1 tablet daily.  2.  Altace 5 mg 1 capsule daily.  3.  Enteric coated aspirin 325 mg 1 tablet daily.  4.  Plavix 75 mg 1 tablet daily with food.  5.  Lipitor 40 mg 2 tablet daily.  6.  Imdur 60 mg 1 tablet daily in the morning.  7.  Allopurinol 300 mg, one-half tablet daily.  8.  Nitrostat 0.4 mg sublingual to be used as directed.   DIET:  Low-salt, low-cholesterol .   ACTIVITY:  The patient is to avoid heavy lifting, pushing or  pulling for 48  hours.   DISCHARGE INSTRUCTIONS:  Post PTCA stent instructions have been given.   FOLLOW UP:  The patient is to follow up with me in one week.  Dr. Shana Chute in  two weeks.   DISCHARGE CONDITION:  The condition at discharge is stable.   The patient has been advised to stop smoking to which he agrees.  The  patient will be scheduled for Phase 2 cardiac rehab as an outpatient.   BRIEF HISTORY AND HOSPITAL COURSE:  Mr. Buda is a 60 year old black male  with a past medical history significant of  coronary artery disease and  history of MI in the past who had coronary artery bypass grafting times two  in 1989.  He had a LIMA  to the LAD and saphenous vein graft to the OM-1,  status post restenting for posterolateral wall MI requiring PTCA and  stenting with the  saphenous vein graft to the OM-1 using __________  proximal protection device with excellent results.  The patient has a  history of hypertension, hypercholesterolemia, history of gouty arthritis,  and tobacco abuse who is admitted for elective PCI to protect the distal  left main and ostial left circumflex, which was not bypassed,   The patient denies any chest pain, shortness of breath, nausea, vomiting,  and diaphoresis, although his activities have been limited following his MI.  He denies any palpation, lightheadedness and syncope.  He denies PND,  orthopnea and leg selling.   PAST MEDICAL HISTORY:  The past medical history as above.   PAST SURGICAL HISTORY:  1.  The patient has had coronary artery bypass grafting times two as above      in 1989.  2.  Jaw and neck fractures many years ago.   ALLERGIES:  No known drug allergies.   MEDICATIONS:  The patient's medications at home include:  1.  Nitro-Dur 0.4 mg/hour daily.  2.  Lopressor 50 mg p.o. twice a day.  3.  Plavix 75 mg p.o.  daily.  4.  Enteric coated aspirin 325 mg p.o. daily.  5.  Lipitor 40 mg p.o. daily.  6.  Altace 2.5 mg p.o. daily.  7.   Allopurinol 300 mg p.o. daily.  8.  Colchicine 0.6 mg p.r.n.   SOCIAL HISTORY:  The patient is married and has four children.  He smoked  one pack/day for 20 years and quit recently.  He drinks whiskey  occasionally.   FAMILY HISTORY:  The family history is noncontributory.   PHYSICAL EXAMINATION:  GENERAL APPEARANCE:  On examination the patient is  alert and oriented times three and in no acute distress.  VITAL SIGNS:  Blood pressure is 130/70 and pulse is 66 and regular.  HEENT:  Conjunctivae are pink.  NECK:  The neck is supple.  No JVD.  No  bruits.  LUNGS:  The lungs are clear to auscultation without rhonchi or wheezing,  HEART:  Cardiovascular exam reveals S1 and S2 to be normal.  There is a soft  systolic murmur.  There is no S3 gallop.  ABDOMEN:  The abdomen is soft.  Bowel sounds are present.  Nontender.  Right  groin is stable, and there is no hematoma or bruit.  EXTREMITIES:  There is no clubbing, cyanosis or edema.   Brief Hospital Course:  The patient was an A.M. admit and underwent a  cardiac cath with selective left coronary angiography  with visualization of  the saphenous vein graft to the OM-1 and then PTCA and stenting to protect  the left main and the left circumflex guided  by intravascular ultrasound  with excellent results as per the procedure report.  The patient did not  have any episodes of chest pain during the hospital stay.  His groin was  stable.  There was no evidence of hematoma or bruit.  The patient had been  ambulating in the hallway without any problems.  Post PCI CPKs and MBs were  negative.   The patient will be discharged home on the above medications and will be  followed up in my office in one week and with Dr. Shana Chute in two weeks.           ______________________________  Eduardo Osier Sharyn Lull, M.D.     MNH/MEDQ  D:  12/24/2004  T:  12/25/2004  Job:  [id number]   cc:   Osvaldo Shipper. Spruill, M.D.  Fax: [phone]

## 2010-06-04 NOTE — Discharge Summary (Signed)
[place]  Patient:    [name], [name] Visit Number: [id number] MRN: [id number]          Service Type: CAT Location: [id number] Attending Physician:  Nathen May Dictated by:   Chinita Pester, N.P. Admit Date:  04/30/2001 Disc. Date: 05/01/01   CC:         Butch Penny, M.D.   Discharge Summary  PRIMARY DIAGNOSIS:  Atrial flutter.  SECONDARY DIAGNOSES: 1. Coronary artery disease status post bypass. 2. Hypertension.  HISTORY OF PRESENT ILLNESS: This is a 60 year old gentleman with a history of atrial flutter.  He was recently hospitalized with atrial flutter and underwent cardioversion and was admitted for ablation.  His thromboembolic risk factors are notable for hypertension.  He has ischemic heart disease with bypass surgery in 1989.  He underwent catheterization in 2000 which demonstrated no significant disease.  He has been on Coumadin therapy.  HOSPITAL COURSE:  He was admitted and underwent successful radiofrequency ablation of his atrial flutter.  He tolerated the procedure well.  He had no immediate postoperative complications and was discharged the following day in stable condition.  DISCHARGE MEDICATIONS:  He was discharged on his previous medications and instructed to restart his Coumadin.  Other medications include Toprol XL 50 mg daily, coated aspirin 81 daily and Lipitor 10 mg nightly.  ACTIVITY:  He was not to do any heavy lifting or strenuous activity for four days and no driving for two.  DIET:  Low fat, low cholesterol, low salt diet.  SPECIAL INSTRUCTIONS:  He was to call if he had any drainage from his groin or lump develop. A PPINR was scheduled on Thursday at the Refill Office at 9:15 a.m.  FOLLOW-UP:  He was to follow with Dr. Graciela Husbands on Jun 12, 2001 at 12 noon in the [place] office.  The patients Coumadin was 7.5 mg nightly, his usual dose. Dictated by:   Chinita Pester, N.P. Attending Physician:   Nathen May DD:  05/01/01 TD:  05/01/01 Job: [id number]

## 2010-06-04 NOTE — H&P (Signed)
[name], [name] NO.:  [id number]   MEDICAL RECORD NO.:  [id number]                   PATIENT TYPE:  EMS   LOCATION:  [facility]                                 FACILITY:  [facility]   PHYSICIAN:  Willa Rough, M.D.                  DATE OF BIRTH:  1950-03-16   DATE OF ADMISSION:  05/16/2002  DATE OF DISCHARGE:                                HISTORY & PHYSICAL   HISTORY OF PRESENT ILLNESS:  The patient is a 60 year old male, status post  remote CABG here at [facility], and history of arrhythmia  treated with radiofrequency catheter ablation by Dr. Duke Salvia  (records currently pending) who now presents to the emergency room with  acute coronary syndrome.   The patient reports having done well since his CABG in the late 80s by Dr.  Gwenith Daily. Tyrone Sage but does report having had a subsequent cardiac  catheterization greater than five years ago which was reportedly negative.  However, the patient presents to the emergency room this evening after  exertional chest discomfort over the past two to three days.  His symptoms  worsened last night after returning home from work.  He developed left  anterior chest pain with radiation to the left biceps, described as a dull,  with no associated dyspnea, diaphoresis, or nausea.  The pain persisted  until 4 o'clock in the morning and then spontaneously resolved.  The patient  did not take any nitroglycerin nor any other medications.  However, his  symptoms have recurred at approximately 4:30 this afternoon after coming  inside of the house and finish mowing the lawn (riding mower).  He was then  transported by his wife to the emergency room.   On presentation, an initial electrocardiogram revealed inferolateral ST  depression but no ST elevation.  He was normotensive initially but blood  pressure dropped to 88 systolic after one nitroglycerin tablet.  A repeat  electrocardiogram at this point  showed development of at least 2 mm of ST  elevation inferiorly with 1 mm ST elevation in leads V4 and V5 and ST  depression in the anteroseptal and lateral leads.  With normalization of  pressure, however, the ST normalized to baseline.  The patient currently  denies any further chest discomfort.   The patient states that these symptoms are reminiscent at base prior to his  surgical revascularization.   ALLERGIES:  No known drug allergies.   CURRENT MEDICATIONS:  None on a regular basis.  Occasional aspirin.   PAST MEDICAL HISTORY:  No other surgeries.   REVIEW OF SYMPTOMS:  Denies history of hypertension or diabetes mellitus.  Denies any recent fever, chills, or productive cough.  Denies any recent  orthopnea, paroxysm nocturnal dyspnea, or lower extremity edema.  Reports a  two week history of exertional dyspnea.  Denies any hemoptysis, hematuria,  or hematochezia.  Denies any GERD-like symptoms.  Denies any history of  congestive heart failure or stroke.  Review of systems is negative.   SOCIAL HISTORY:  Married.  Four children.  Works as a Engineer, manufacturing.  Admits to a 30-pack-year history of tobacco smoking.  Also  admits to at least moderate (if not heavy) alcohol use.  Denies illicit drug  use.   FAMILY HISTORY:  Negative for coronary artery disease.   LABORATORY DATA:  WBC 9.3, HGB 13.9, platelets 269,000.  Sodium 143,  potassium 4.3, glucose 100, BUN 12, creatinine 1.3.  Liver enzymes are  normal.  INR 0.8.  Alcohol level 207.  Total CPK of 205.  MB of 3.9 and  currently pending.   PHYSICAL EXAMINATION:  VITAL SIGNS:  Blood pressure 130/86, pulse 72,  respirations 26, temperature 97.6.  Saturation O2 100% on room air.  GENERAL:  This is a 60 year old male.  HEENT:  Normocephalic and atraumatic.  NECK:  There are carotid pulses without bruits.  Significant for no jugular  venous distention.  LUNGS:  Diminished for sounds at the bases.  Absent for any  crackles or  wheezes.  HEART:  Regular rate and rhythm.  Positive S4.  No S3.  No significant  murmurs.  ABDOMEN:  Soft and nontender.  Intact bowel sounds without bruits.  No  hepatosplenomegaly.  EXTREMITIES:  Preserved bilateral femoral and dorsalis pedis pulses.  No  significant pedal edema.  NEUROLOGICAL:  Alert and oriented.   IMPRESSION:  1. Acute coronary syndrome.     a. Dynamic electrocardiogram changes.  2. Coronary artery disease.     a. Status post remote coronary artery bypass graft.  3. History of arrhythmia.     a. Status post radiofrequency catheter ablation (Dr. Duke Salvia).  4. Tobacco/ongoing use.  5. Alcohol abuse.  6. History of dyslipidemia.   PLAN:  The patient is fairly asymptomatic, pain-free, with resolution of the  ST abnormalities following normalization of the blood pressure.  The patient  has already received aspirin and we will proceed with the addition of  intravenous heparin and initiation of Integrilin following consultation with  Dr. Arturo Morton. Stuckey.  Recommendation is to also initiate Plavix with a  loading dose of 150 mg q.d. and then 75 mg q.d.  Use of the intravenous beta  blockers will be deferred in the emergency room given the sudden drop in  blood pressure.  We will, however, initiate oral Lopressor 25 mg b.i.d.  We  will also gently hydrate to maintain adequate blood pressure.  The patient  is to maintain adequate blood pressure with concomitant low dose intravenous  nitroglycerin.  In addition to  cycling cardiac enzymes and assessing lipid profile, we will also check an  alcohol level and place the patient on alcohol withdrawal precautions.  Recommendations is to proceed with diagnostic coronary angiography and  possible percutaneous intervention in the morning or sooner if needed.     Gene Serpe, P.A. [facility]                      Willa Rough, M.D.   GS/MEDQ  D:  05/16/2002  T:  05/16/2002  Job:  [id number]   cc:   Osvaldo Shipper.  Spruill, M.D.  [address] [address]  [place]  [place] [address]  Fax: [phone]   Angus G. Renard Matter, M.D.  [address]  [place]  [place] [address]  Fax: [phone]

## 2010-06-04 NOTE — Procedures (Signed)
[place]  Patient:    CALDEN, DORSEY Visit Number: [id number] MRN: [id number]          Service Type: OUT Location: RAD Attending Physician:  Alice Reichert Dictated by:   Noralyn Pick Eden Emms, M.D. [facility] Proc. Date: 03/29/01 Admit Date:  03/26/2001 Discharge Date: 03/26/2001   CC:         Echocardiography Laboratory  Nathen May, M.D. [facility]   Procedure Report  PROCEDURES: Transesophageal echocardiography cardioversion.  INDICATIONS: Atrial flutter.  SEDATION: The patient was sedated with 10 mg Versed.  DESCRIPTION OF PROCEDURE: Using an Omniplane probe multiple Omnipaque views were obtained. The transgastric imaging revealed mild LVH with mild to moderate LV enlargement with moderate global hypokinesis and EF of 40%. There was mild MR. Aortic valve was trileaflet and normal. Cardiac chambers were normal with mild to moderate TR. The left atrial appendage was small with no thrombus and no smoke.  After the TEE, the patient was anesthetized with 150 mg of sodium Pentothal. A single synchronized 200 joule biphasic shock was delivered and the patient converted to normal sinus rhythm.  IMPRESSION: Successful transesophageal echocardiography-guided cardioversion with no immediate complications.  PLAN: The patient will be discharged home when his INR is above 2.5. Continuous heparin was going at the time of the procedure and the patient had a therapeutic PTT. Dictated by:   Noralyn Pick Eden Emms, M.D. [facility] Attending Physician:  Alice Reichert DD:  03/29/01 TD:  03/31/01 Job: [id number]

## 2011-04-12 ENCOUNTER — Encounter (HOSPITAL_BASED_OUTPATIENT_CLINIC_OR_DEPARTMENT_OTHER): Admission: RE | Payer: Self-pay | Source: Ambulatory Visit

## 2011-04-12 ENCOUNTER — Inpatient Hospital Stay (HOSPITAL_BASED_OUTPATIENT_CLINIC_OR_DEPARTMENT_OTHER): Admission: RE | Admit: 2011-04-12 | Payer: Medicare Other | Source: Ambulatory Visit | Admitting: Cardiology

## 2011-04-12 SURGERY — JV LEFT HEART CATHETERIZATION WITH CORONARY ANGIOGRAM
Anesthesia: Moderate Sedation

## 2011-04-13 ENCOUNTER — Ambulatory Visit (INDEPENDENT_AMBULATORY_CARE_PROVIDER_SITE_OTHER): Payer: Medicare Other | Admitting: Family Medicine

## 2011-04-13 VITALS — BP 171/73 | HR 80 | Temp 98.2°F | Resp 16 | Ht 72.0 in

## 2011-04-13 DIAGNOSIS — M109 Gout, unspecified: Secondary | ICD-10-CM

## 2011-04-13 DIAGNOSIS — M25569 Pain in unspecified knee: Secondary | ICD-10-CM

## 2011-04-13 MED ORDER — METHYLPREDNISOLONE 4 MG PO KIT: PACK | ORAL | Status: AC

## 2011-04-13 MED ORDER — TRIAMCINOLONE ACETONIDE 40 MG/ML IJ SUSP
40.0000 mg | Freq: Once | INTRAMUSCULAR | Status: AC
  Administered 2011-04-13: 40 mg via INTRA_ARTICULAR

## 2011-04-13 NOTE — Progress Notes (Signed)
61 yo retired [employer] fund[employer] with spontaneous right knee pain thought to be gout, starting 5 days ago, and taking Colchrys x 48 hours without improvement.  No fever.  H/O gout in feet 2 x per year.  O:  Moderate effusion right knee with decreased flexion and extension.  Mild tenderness without erythema  After informed consent, area prepped with betadine and anesth with xylo with epi Then 18 cc cloudy yellow fluid aspirated Then 2 cc of kenalog and marcaine (1 cc of each)  Pt improved 30-40% during visit  A:  Gouty knee, right, acute  P:  See if the shot will work Medrol dospak Continue the allopurinol.

## 2011-04-13 NOTE — Patient Instructions (Signed)
Gout Gout is an inflammatory condition (arthritis) caused by a buildup of uric acid crystals in the joints. Uric acid is a chemical that is normally present in the blood. Under some circumstances, uric acid can form into crystals in your joints. This causes joint redness, soreness, and swelling (inflammation). Repeat attacks are common. Over time, uric acid crystals can form into masses (tophi) near a joint, causing disfigurement. Gout is treatable and often preventable. CAUSES  The disease begins with elevated levels of uric acid in the blood. Uric acid is produced by your body when it breaks down a naturally found substance called purines. This also happens when you eat certain foods such as meats and fish. Causes of an elevated uric acid level include:  Being passed down from parent to child (heredity).   Diseases that cause increased uric acid production (obesity, psoriasis, some cancers).   Excessive alcohol use.   Diet, especially diets rich in meat and seafood.   Medicines, including certain cancer-fighting drugs (chemotherapy), diuretics, and aspirin.   Chronic kidney disease. The kidneys are no longer able to remove uric acid well.   Problems with metabolism.  Conditions strongly associated with gout include:  Obesity.   High blood pressure.   High cholesterol.   Diabetes.  Not everyone with elevated uric acid levels gets gout. It is not understood why some people get gout and others do not. Surgery, joint injury, and eating too much of certain foods are some of the factors that can lead to gout. SYMPTOMS   An attack of gout comes on quickly. It causes intense pain with redness, swelling, and warmth in a joint.   Fever can occur.   Often, only one joint is involved. Certain joints are more commonly involved:   Base of the big toe.   Knee.   Ankle.   Wrist.   Finger.  Without treatment, an attack usually goes away in a few days to weeks. Between attacks, you  usually will not have symptoms, which is different from many other forms of arthritis. DIAGNOSIS  Your caregiver will suspect gout based on your symptoms and exam. Removal of fluid from the joint (arthrocentesis) is done to check for uric acid crystals. Your caregiver will give you a medicine that numbs the area (local anesthetic) and use a needle to remove joint fluid for exam. Gout is confirmed when uric acid crystals are seen in joint fluid, using a special microscope. Sometimes, blood, urine, and X-ray tests are also used. TREATMENT  There are 2 phases to gout treatment: treating the sudden onset (acute) attack and preventing attacks (prophylaxis). Treatment of an Acute Attack  Medicines are used. These include anti-inflammatory medicines or steroid medicines.   An injection of steroid medicine into the affected joint is sometimes necessary.   The painful joint is rested. Movement can worsen the arthritis.   You may use warm or cold treatments on painful joints, depending which works best for you.   Discuss the use of coffee, vitamin C, or cherries with your caregiver. These may be helpful treatment options.  Treatment to Prevent Attacks After the acute attack subsides, your caregiver may advise prophylactic medicine. These medicines either help your kidneys eliminate uric acid from your body or decrease your uric acid production. You may need to stay on these medicines for a very long time. The early phase of treatment with prophylactic medicine can be associated with an increase in acute gout attacks. For this reason, during the first few months   of treatment, your caregiver may also advise you to take medicines usually used for acute gout treatment. Be sure you understand your caregiver's directions. You should also discuss dietary treatment with your caregiver. Certain foods such as meats and fish can increase uric acid levels. Other foods such as dairy can decrease levels. Your caregiver  can give you a list of foods to avoid. HOME CARE INSTRUCTIONS   Do not take aspirin to relieve pain. This raises uric acid levels.   Only take over-the-counter or prescription medicines for pain, discomfort, or fever as directed by your caregiver.   Rest the joint as much as possible. When in bed, keep sheets and blankets off painful areas.   Keep the affected joint raised (elevated).   Use crutches if the painful joint is in your leg.   Drink enough water and fluids to keep your urine clear or pale yellow. This helps your body get rid of uric acid. Do not drink alcoholic beverages. They slow the passage of uric acid.   Follow your caregiver's dietary instructions. Pay careful attention to the amount of protein you eat. Your daily diet should emphasize fruits, vegetables, whole grains, and fat-free or low-fat milk products.   Maintain a healthy body weight.  SEEK MEDICAL CARE IF:   You have an oral temperature above 102 F (38.9 C).   You develop diarrhea, vomiting, or any side effects from medicines.   You do not feel better in 24 hours, or you are getting worse.  SEEK IMMEDIATE MEDICAL CARE IF:   Your joint becomes suddenly more tender and you have:   Chills.   An oral temperature above 102 F (38.9 C), not controlled by medicine.  MAKE SURE YOU:   Understand these instructions.   Will watch your condition.   Will get help right away if you are not doing well or get worse.  Document Released: 01/01/2000 Document Revised: 12/23/2010 Document Reviewed: 04/13/2009 Exit[employer] Patient Information 2012 [employer]. 

## 2011-04-14 ENCOUNTER — Inpatient Hospital Stay (HOSPITAL_BASED_OUTPATIENT_CLINIC_OR_DEPARTMENT_OTHER)
Admission: RE | Admit: 2011-04-14 | Discharge: 2011-04-14 | Disposition: A | Discharge status: Home / Self Care | Payer: Medicare Other | Source: Ambulatory Visit | Attending: Cardiology | Admitting: Cardiology

## 2011-04-14 ENCOUNTER — Encounter (HOSPITAL_BASED_OUTPATIENT_CLINIC_OR_DEPARTMENT_OTHER)
Admission: RE | Disposition: A | Discharge status: Home / Self Care | Payer: Self-pay | Source: Ambulatory Visit | Attending: Cardiology

## 2011-04-14 DIAGNOSIS — I252 Old myocardial infarction: Secondary | ICD-10-CM | POA: Insufficient documentation

## 2011-04-14 DIAGNOSIS — I2510 Atherosclerotic heart disease of native coronary artery without angina pectoris: Secondary | ICD-10-CM | POA: Insufficient documentation

## 2011-04-14 DIAGNOSIS — I25810 Atherosclerosis of coronary artery bypass graft(s) without angina pectoris: Secondary | ICD-10-CM | POA: Insufficient documentation

## 2011-04-14 DIAGNOSIS — I209 Angina pectoris, unspecified: Secondary | ICD-10-CM | POA: Insufficient documentation

## 2011-04-14 LAB — SYNOVIAL CELL COUNT + DIFF, W/ CRYSTALS
Eosinophils-Synovial: 0 % (ref 0–1)
Lymphocytes-Synovial Fld: 1 % (ref 0–20)
Monocyte/Macrophage: 3 % — ABNORMAL LOW (ref 50–90)
Neutrophil, Synovial: 96 % — ABNORMAL HIGH (ref 0–25)
WBC, Synovial: 5520 cu mm — ABNORMAL HIGH (ref 0–200)

## 2011-04-14 SURGERY — JV LEFT HEART CATHETERIZATION WITH CORONARY ANGIOGRAM
Anesthesia: Moderate Sedation

## 2011-04-14 MED ORDER — ACETAMINOPHEN 325 MG PO TABS: 650.0000 mg | ORAL_TABLET | ORAL | Status: DC | PRN

## 2011-04-14 MED ORDER — SODIUM CHLORIDE 0.9 % IV SOLN: INTRAVENOUS | Status: DC

## 2011-04-14 MED ORDER — ONDANSETRON HCL 4 MG/2ML IJ SOLN: 4.0000 mg | Freq: Four times a day (QID) | INTRAMUSCULAR | Status: DC | PRN

## 2011-04-14 NOTE — Discharge Instructions (Signed)
Coronary Angiography  Coronary angiography is an X-ray procedure used to look at the arteries in the heart. In this procedure, a dye is injected through a long, hollow tube (catheter). The catheter is about the size of a piece of cooked spaghetti. The catheter injects a dye into an artery in your groin. X-rays are then taken to show if there is a blockage in the arteries of your heart.  BEFORE THE PROCEDURE     Let your caregiver know if you have allergies to shellfish or contrast dye. Also let your caregiver know if you have kidney problems or failure.   Do not eat or drink starting from midnight up to the time of the procedure, or as directed.   You may drink enough water to take your medications the morning of the procedure if you were instructed to do so.   You should be at the hospital or outpatient facility where the procedure is to be done 60 minutes prior to the procedure or as directed.  PROCEDURE   You may be given an IV medication to help you relax before the procedure.   You will be prepared for the procedure by washing and shaving the area where the catheter will be inserted. This is usually done in the groin but may be done in the fold of your arm by your elbow.   A medicine will be given to numb your groin where the catheter will be inserted.   A specially trained doctor will insert the catheter into an artery in your groin. The catheter is guided by using a special type of X-ray (fluoroscopy) to the blood vessel being examined.   A special dye is then injected into the catheter and X-rays are taken. The dye helps to show where any narrowing or blockages are located in the heart arteries.  AFTER THE PROCEDURE     After the procedure you will be kept in bed lying flat for several hours. You will be instructed to not bend or cross your legs.   The groin insertion site will be watched and checked frequently.   The pulse in your feet will be checked frequently.   Additional blood tests,  X-rays and an EKG may be done.   You may stay in the hospital overnight for observation.  SEEK IMMEDIATE MEDICAL CARE IF:     You develop chest pain, shortness of breath, feel faint, or pass out.   There is bleeding, swelling, or drainage from the catheter insertion site.   You develop pain, discoloration, coldness, or severe bruising in the leg or area where the catheter was inserted.   You have a fever.  Document Released: 07/10/2002 Document Revised: 12/23/2010 Document Reviewed: 08/29/2007  [employer] Patient Information 2012 [employer].

## 2011-04-14 NOTE — H&P (Signed)
  Handwritten H&P in the chart no changes

## 2011-04-14 NOTE — Cardiovascular Report (Signed)
NAMEVERNOR, MONNIG NO.:  [id number]  MEDICAL RECORD NO.:  [id number]  LOCATION:                                 FACILITY:  PHYSICIAN:  Fani Rotondo N. Sharyn Lull, M.D.      DATE OF BIRTH:  DATE OF PROCEDURE:  04/14/2011 DATE OF DISCHARGE:                           CARDIAC CATHETERIZATION   PROCEDURE:  Left cardiac catheterization with selective left and right coronary angiography, visualization of the LIMA graft and saphenous vein graft via LV graphy via right groin using Judkins technique.  INDICATION FOR THE PROCEDURE:  [name] is a 61 year old black male with past medical history significant for coronary artery disease, history of inferoposterior wall MI in 2006 and also had MI in 1989, status post CABG, he had LIMA to LAD, saphenous vein graft to OM 1, and subsequently had PTCA stenting to saphenous vein graft to OM 1 in November 2006 and also had PTCA stenting to a protected left main in December 2006, hypertension, hypercholesteremia, history of gouty arthritis, tobacco abuse.  He complains of exertional retrosternal and left-sided chest pain, off and on, associated with feeling weak and tired.  The patient also gives history of exertional dyspnea with minimal exertion.  Denies any palpitation, lightheadedness, or syncope.  Denies PND, orthopnea, or leg swelling.  Patient denies relation of chest pain to food, breathing, or movement.  PAST MEDICAL HISTORY:  As above.  PAST SURGICAL HISTORY:  He had CABG in 1989, also had jaw and neck fractures many years ago.  SOCIAL HISTORY:  He is married, four children.  Smoked 1 pack per day for 40+ years.  Drinks occasionally socially.  FAMILY HISTORY:  Noncontributory.  ALLERGIES:  No known drug allergies.  MEDICATIONS:  At home, he is on Toprol-XL 100 mg in a.m. and 50 mg in p.m., ramipril 10 mg p.o. daily, enteric-coated aspirin 81 mg p.o. daily, Atorvastatin 40 mg p.o. daily, Nitro-Dur 0.4 mg/hour  daily, allopurinol 100 mg p.o. daily, colchicine 0.6 mg p.o. daily, and Nitrostat sublingual p.r.n.  PHYSICAL EXAMINATION:  GENERAL:  He was alert, awake, oriented x3, in no acute distress. VITAL SIGNS:  Blood pressure was 150/80, pulse was 88. HEENT:  Conjunctiva was pink. NECK:  Supple.  No JVD.  No bruit. RESPIRATORY:  Lungs were clear to auscultation without rhonchi or rales. CARDIOVASCULAR:  S1, S2 was normal.  There was soft systolic murmur and S4 gallop. ABDOMEN:  Soft.  Bowel sounds were present.  Nontender. EXTREMITIES:  No clubbing, cyanosis, or edema.  IMPRESSION:  Accelerated angina, rule out progression of coronary artery disease; coronary artery disease; history of myocardial infarction x2 in the past; status post coronary artery bypass graft and percutaneous coronary intervention as above; hypertension; hypercholesteremia; history of tobacco abuse; history of erectile dysfunction; also history of gouty arthritis.  I discussed with the patient regarding left cath, its risks and benefits, i.e., death, myocardial infarction, stroke, local vascular complications, etc., and consented for the procedure.  DESCRIPTION OF PROCEDURE:  After obtaining the informed consent, the patient was brought to the cath lab and was placed on fluoroscopy table. Right groin was prepped and draped in usual fashion.  Xylocaine 1%  was used for local anesthesia in the right groin.  With the help of thin- wall needle, 4-French arterial sheath was placed.  The sheath was aspirated and flushed.  A 4-French left Judkins catheter was advanced over the wire under fluoroscopic guidance up to the ascending aorta.  Wire was pulled out. The catheter was aspirated and connected to the Manifold.  Catheter was further advanced and engaged into the left coronary ostium.  Multiple views of the left system were taken.  The catheter was disengaged and was pulled out over the wire and was replaced with  4-French 3D right diagnostic catheter, which was advanced over the wire under fluoroscopic guidance up to the ascending aorta. Wire was pulled out.  The catheter was aspirated and connected to the Manifold.  Catheter was further advanced and engaged into right coronary ostium.  Multiple views of the right system were taken.  The catheter was disengaged and was pulled out over the wire and was replaced with 4-French.  The catheter was disengaged and was engaged into saphenous vein graft to obtuse marginal and a single view of this graft was taken.  The catheter was disengaged and was advanced under fluoroscopic guidance up to the left subclavian artery and over the wire and was engaged into LIMA to LAD.  Multiple views of this graft were taken.  The catheter was disengaged and was pulled out over the wire and was replaced with 4-French pigtail catheter, which was advanced over the wire under fluoroscopic guidance up to the ascending aorta.  Wire was pulled out.  The catheter was aspirated and connected to the Manifold. The catheter was further advanced across the aortic valve into the LV. LV pressures were recorded.  LV graft was done in 30-degree RAO position.  Post-angiographic pressures were recorded from LV and then pullback pressures were recorded from the aorta.  There was no gradient across the aortic valve.  The pigtail catheter was pulled out over the wire.  Sheaths were aspirated and flushed.  FINDINGS:  LV showed good LV systolic function.  There was mild inferior wall hypokinesia,  EF of approximately 50-55%.  Left main was patent. Ostial circ has 10-15% in-stent restenosis.  LAD was 100% occluded at the ostium.  Ramus had 80-85% stenosis.  The vessel was less than 0.5 mm.  OM 1 and OM 2 were 100% occluded.  RCA has 10-20% mid stenosis. PDA and PLV branches were small, which were patent.  Saphenous vein graft to obtuse marginal was 100% occluded as before.  The LIMA to  LAD was patent.  There was moderate sized pectoralis/intercostal branch, which was not ligated, was patent.  The patient tolerated the procedure well.  There were no complications.  The patient was transferred to recovery room in stable condition.  PLAN:  Maximize antianginal medications and treat medically.     [name]. Sharyn Lull, M.D.     MNH/MEDQ  D:  04/14/2011  T:  04/14/2011  Job:  [id number]

## 2011-04-14 NOTE — CV Procedure (Signed)
Cardiac cath report dictated on 04/14/2011 dictation number is [phone]

## 2011-04-14 NOTE — OR Nursing (Signed)
Meal served 

## 2011-04-14 NOTE — OR Nursing (Signed)
Discharge instructions reviewed and signed, pt stated understanding, ambulated in hall without difficulty, site level 0, transported to wife's car via wheelchair 

## 2011-04-14 NOTE — OR Nursing (Signed)
Tegaderm dressing applied, site level 0, bedrest began at 0945

## 2011-04-17 LAB — BODY FLUID CULTURE
Gram Stain: NONE SEEN
Organism ID, Bacteria: NO GROWTH

## 2011-05-04 ENCOUNTER — Other Ambulatory Visit: Payer: Self-pay | Admitting: Cardiology

## 2011-05-07 ENCOUNTER — Other Ambulatory Visit: Payer: Self-pay | Admitting: Cardiology

## 2011-05-23 ENCOUNTER — Other Ambulatory Visit: Payer: Self-pay | Admitting: Cardiology

## 2011-07-08 ENCOUNTER — Ambulatory Visit (INDEPENDENT_AMBULATORY_CARE_PROVIDER_SITE_OTHER): Payer: Medicare Other | Admitting: Family Medicine

## 2011-07-08 VITALS — BP 147/63 | HR 54 | Temp 97.7°F | Resp 16 | Ht 72.0 in | Wt 205.2 lb

## 2011-07-08 DIAGNOSIS — M109 Gout, unspecified: Secondary | ICD-10-CM

## 2011-07-08 DIAGNOSIS — M25569 Pain in unspecified knee: Secondary | ICD-10-CM

## 2011-07-08 MED ORDER — TRAMADOL HCL 50 MG PO TABS: 50.0000 mg | ORAL_TABLET | Freq: Three times a day (TID) | ORAL | Status: AC | PRN

## 2011-07-08 MED ORDER — COLCHICINE 0.6 MG PO TABS: ORAL_TABLET | ORAL | Status: DC

## 2011-07-08 NOTE — Progress Notes (Signed)
Patient Name: [name] Date of Birth: 12/01/1950 Medical Record Number: [id number] Gender: male Date of Encounter: 07/08/2011  History of Present Illness:  [name] is a 61 y.o. very pleasant male patient who presents with the following:  History of gout which tends to occur in his knees.  He was recently at the beach, and he drank too much beer/ ate too much seafood.  His symptoms started first in his feet this past Sunday- then spread to both knees.  He had been on allopurinol, but he ran out yesterday.  He does not have any colchicine or ultram on hand- these have helped him in the past.  He was here in march and had a flare in just his right knee.  He usually has a gout flare twice a year or so.  This is a typical flare for him.    At this point his left ankle and right knee are the worst joints  History of GI bleed in the past, as well as CAD with stents,  He is now off plavix and just takes a baby asa daily- likely need to avoid NSAIDs.  Does not have any CP or need to use his nitro  There is no problem list on file for this patient.  No past medical history on file. No past surgical history on file. History  Substance Use Topics  . Smoking status: Current Everyday Smoker -- 0.4 packs/day    Types: Cigarettes  . Smokeless tobacco: Not on file  . Alcohol Use: Not on file   No family history on file. No Known Allergies  Medication list has been reviewed and updated.  Prior to Admission medications   Medication Sig Start Date End Date Taking? Authorizing Provider  aspirin 81 MG tablet Take 81 mg by mouth daily.   Yes Historical Provider, MD  metoprolol succinate (TOPROL-XL) 100 MG 24 hr tablet Take 100 mg by mouth daily. Take with or immediately following a meal.   Yes Historical Provider, MD  metoprolol succinate (TOPROL-XL) 50 MG 24 hr tablet Take 50 mg by mouth daily. Take with or immediately following a meal.   Yes Historical Provider, MD  nitroGLYCERIN  (NITROSTAT) 0.4 MG SL tablet Place 0.4 mg under the tongue every 5 (five) minutes as needed.   Yes Historical Provider, MD  ramipril (ALTACE) 10 MG capsule Take 10 mg by mouth daily.   Yes Historical Provider, MD    Review of Systems:  Typical gout flare.  No other symptoms.  Using a walker to get around  Physical Examination: Filed Vitals:   07/08/11 1058  BP: 147/63  Pulse: 54  Temp: 97.7 F (36.5 C)  Resp: 16   Filed Vitals:   07/08/11 1058  Height: 6' (1.829 m)  Weight: 205 lb 3.2 oz (93.078 kg)   Body mass index is 27.83 kg/(m^2). Ideal Body Weight: Weight in (lb) to have BMI = 25: 183.9   GEN: WDWN, NAD, Non-toxic, A & O x 3 HEENT: Atraumatic, Normocephalic. Neck supple. No masses, No LAD. Ears and Nose: No external deformity. CV: RRR, No M/G/R. No JVD. No thrill. No extra heart sounds. PULM: CTA B, no wheezes, crackles, rhonchi. No retractions. No resp. distress. No accessory muscle use. ABD: S, NT, ND, +BS. No rebound. No HSM. EXTR:  Left ankle and right knee are the most affected, left knee and right ankle less so. All of these joints are swollen and tender with movement.  Knees  with joint effusions.  NEURO antalgic gait, using a walker PSYCH: Normally interactive. Conversant. Not depressed or anxious appearing.  Calm demeanor.    Assessment and Plan: 1. Gout  colchicine 0.6 MG tablet, traMADol (ULTRAM) 50 MG tablet  2. Pain in joint involving lower leg     Here with gout in multiple joints. Will start on colchicine and tramadol as above.  He wondered about doing a joint injection- however due to current poly- articular attack this approach is likely less helpful.    Also gave rx for prednisone 40 X3, 20 X3 to hold. Start this if not better with above measures in a couple of days.  Patient (or parent if minor) instructed to return to clinic or call if not better in 2-3 day(s).  Sooner if worse.     Abbe Amsterdam, MD

## 2011-09-12 ENCOUNTER — Other Ambulatory Visit: Payer: Self-pay | Admitting: Cardiology

## 2011-10-08 ENCOUNTER — Other Ambulatory Visit: Payer: Self-pay | Admitting: Cardiology

## 2012-09-21 ENCOUNTER — Ambulatory Visit
Admission: RE | Admit: 2012-09-21 | Discharge: 2012-09-21 | Disposition: A | Discharge status: Home / Self Care | Payer: Medicare Other | Source: Ambulatory Visit | Attending: Cardiology | Admitting: Cardiology

## 2012-09-21 ENCOUNTER — Other Ambulatory Visit: Payer: Self-pay | Admitting: Cardiology

## 2012-09-21 ENCOUNTER — Other Ambulatory Visit (HOSPITAL_COMMUNITY): Payer: Self-pay | Admitting: Cardiology

## 2012-09-21 DIAGNOSIS — I10 Essential (primary) hypertension: Secondary | ICD-10-CM

## 2012-09-21 DIAGNOSIS — R079 Chest pain, unspecified: Secondary | ICD-10-CM

## 2012-09-21 DIAGNOSIS — I2510 Atherosclerotic heart disease of native coronary artery without angina pectoris: Secondary | ICD-10-CM

## 2012-09-28 ENCOUNTER — Encounter (HOSPITAL_COMMUNITY): Payer: Medicare Other

## 2013-05-15 ENCOUNTER — Other Ambulatory Visit (HOSPITAL_COMMUNITY): Payer: Self-pay | Admitting: Cardiology

## 2013-05-15 DIAGNOSIS — R079 Chest pain, unspecified: Secondary | ICD-10-CM

## 2013-06-03 ENCOUNTER — Encounter (HOSPITAL_COMMUNITY): Payer: Medicare Other

## 2013-06-03 ENCOUNTER — Ambulatory Visit (HOSPITAL_COMMUNITY): Admission: RE | Admit: 2013-06-03 | Payer: Medicare Other | Source: Ambulatory Visit

## 2013-06-03 ENCOUNTER — Encounter (HOSPITAL_COMMUNITY): Admission: RE | Admit: 2013-06-03 | Payer: Medicare Other | Source: Ambulatory Visit

## 2013-06-14 ENCOUNTER — Encounter (HOSPITAL_COMMUNITY)
Admission: RE | Admit: 2013-06-14 | Discharge: 2013-06-14 | Disposition: A | Discharge status: Home / Self Care | Payer: Medicare Other | Source: Ambulatory Visit | Attending: Cardiology | Admitting: Cardiology

## 2013-06-14 ENCOUNTER — Ambulatory Visit (HOSPITAL_COMMUNITY)
Admission: RE | Admit: 2013-06-14 | Discharge: 2013-06-14 | Disposition: A | Discharge status: Home / Self Care | Payer: Medicare Other | Source: Ambulatory Visit | Attending: Cardiology | Admitting: Cardiology

## 2013-06-14 ENCOUNTER — Other Ambulatory Visit: Payer: Self-pay

## 2013-06-14 DIAGNOSIS — R079 Chest pain, unspecified: Secondary | ICD-10-CM | POA: Insufficient documentation

## 2013-06-14 DIAGNOSIS — Z79899 Other long term (current) drug therapy: Secondary | ICD-10-CM | POA: Insufficient documentation

## 2013-06-14 MED ORDER — REGADENOSON 0.4 MG/5ML IV SOLN
0.4000 mg | Freq: Once | INTRAVENOUS | Status: AC
  Administered 2013-06-14: 0.4 mg via INTRAVENOUS

## 2013-06-14 MED ORDER — REGADENOSON 0.4 MG/5ML IV SOLN
INTRAVENOUS | Status: AC
  Filled 2013-06-14: qty 5

## 2013-06-14 MED ORDER — TECHNETIUM TC 99M SESTAMIBI GENERIC - CARDIOLITE: 10.0000 | Freq: Once | INTRAVENOUS | Status: DC | PRN

## 2013-06-14 MED ORDER — TECHNETIUM TC 99M SESTAMIBI GENERIC - CARDIOLITE
30.0000 | Freq: Once | INTRAVENOUS | Status: AC | PRN
  Administered 2013-06-14: 30 via INTRAVENOUS

## 2013-06-14 MED ORDER — TECHNETIUM TC 99M SESTAMIBI GENERIC - CARDIOLITE
10.0000 | Freq: Once | INTRAVENOUS | Status: AC | PRN
  Administered 2013-06-14: 10 via INTRAVENOUS

## 2013-07-04 ENCOUNTER — Encounter (HOSPITAL_COMMUNITY): Payer: Self-pay | Admitting: Pharmacy Technician

## 2013-07-09 ENCOUNTER — Encounter (HOSPITAL_COMMUNITY)
Admission: RE | Disposition: A | Discharge status: Home / Self Care | Payer: Self-pay | Source: Ambulatory Visit | Attending: Cardiology

## 2013-07-09 ENCOUNTER — Ambulatory Visit (HOSPITAL_COMMUNITY)
Admission: RE | Admit: 2013-07-09 | Discharge: 2013-07-09 | Disposition: A | Discharge status: Home / Self Care | Payer: Medicare Other | Source: Ambulatory Visit | Attending: Cardiology | Admitting: Cardiology

## 2013-07-09 DIAGNOSIS — F17200 Nicotine dependence, unspecified, uncomplicated: Secondary | ICD-10-CM | POA: Insufficient documentation

## 2013-07-09 DIAGNOSIS — I10 Essential (primary) hypertension: Secondary | ICD-10-CM | POA: Insufficient documentation

## 2013-07-09 DIAGNOSIS — N529 Male erectile dysfunction, unspecified: Secondary | ICD-10-CM | POA: Insufficient documentation

## 2013-07-09 DIAGNOSIS — R9439 Abnormal result of other cardiovascular function study: Secondary | ICD-10-CM | POA: Insufficient documentation

## 2013-07-09 DIAGNOSIS — Z951 Presence of aortocoronary bypass graft: Secondary | ICD-10-CM | POA: Insufficient documentation

## 2013-07-09 DIAGNOSIS — I25810 Atherosclerosis of coronary artery bypass graft(s) without angina pectoris: Secondary | ICD-10-CM | POA: Insufficient documentation

## 2013-07-09 DIAGNOSIS — I2510 Atherosclerotic heart disease of native coronary artery without angina pectoris: Secondary | ICD-10-CM | POA: Insufficient documentation

## 2013-07-09 DIAGNOSIS — I252 Old myocardial infarction: Secondary | ICD-10-CM | POA: Insufficient documentation

## 2013-07-09 DIAGNOSIS — I2582 Chronic total occlusion of coronary artery: Secondary | ICD-10-CM | POA: Insufficient documentation

## 2013-07-09 DIAGNOSIS — Z9861 Coronary angioplasty status: Secondary | ICD-10-CM | POA: Insufficient documentation

## 2013-07-09 DIAGNOSIS — E785 Hyperlipidemia, unspecified: Secondary | ICD-10-CM | POA: Insufficient documentation

## 2013-07-09 HISTORY — PX: LEFT HEART CATHETERIZATION WITH CORONARY/GRAFT ANGIOGRAM: SHX5450

## 2013-07-09 SURGERY — LEFT HEART CATHETERIZATION WITH CORONARY/GRAFT ANGIOGRAM
Anesthesia: LOCAL

## 2013-07-09 MED ORDER — SODIUM CHLORIDE 0.9 % IJ SOLN: 3.0000 mL | Freq: Two times a day (BID) | INTRAMUSCULAR | Status: DC

## 2013-07-09 MED ORDER — SODIUM CHLORIDE 0.9 % IJ SOLN
3.0000 mL | INTRAMUSCULAR | Status: DC | PRN
Start: 2013-07-09 — End: 2013-07-09

## 2013-07-09 MED ORDER — NITROGLYCERIN 0.2 MG/ML ON CALL CATH LAB
INTRAVENOUS | Status: AC
  Filled 2013-07-09: qty 1

## 2013-07-09 MED ORDER — LIDOCAINE HCL (PF) 1 % IJ SOLN
INTRAMUSCULAR | Status: AC
Start: 2013-07-09 — End: 2013-07-09
  Filled 2013-07-09: qty 30

## 2013-07-09 MED ORDER — FENTANYL CITRATE 0.05 MG/ML IJ SOLN
INTRAMUSCULAR | Status: AC
  Filled 2013-07-09: qty 2

## 2013-07-09 MED ORDER — OXYCODONE-ACETAMINOPHEN 5-325 MG PO TABS: 1.0000 | ORAL_TABLET | ORAL | Status: DC | PRN

## 2013-07-09 MED ORDER — ASPIRIN 81 MG PO CHEW: 81.0000 mg | CHEWABLE_TABLET | ORAL | Status: DC

## 2013-07-09 MED ORDER — SODIUM CHLORIDE 0.9 % IV SOLN
INTRAVENOUS | Status: DC
  Administered 2013-07-09: 1000 mL via INTRAVENOUS

## 2013-07-09 MED ORDER — HEPARIN (PORCINE) IN NACL 2-0.9 UNIT/ML-% IJ SOLN
INTRAMUSCULAR | Status: AC
Start: 2013-07-09 — End: 2013-07-09
  Filled 2013-07-09: qty 2000

## 2013-07-09 MED ORDER — MIDAZOLAM HCL 2 MG/2ML IJ SOLN
INTRAMUSCULAR | Status: AC
  Filled 2013-07-09: qty 2

## 2013-07-09 MED ORDER — SODIUM CHLORIDE 0.9 % IV SOLN: INTRAVENOUS | Status: DC

## 2013-07-09 MED ORDER — SODIUM CHLORIDE 0.9 % IV SOLN: 250.0000 mL | INTRAVENOUS | Status: DC | PRN

## 2013-07-09 NOTE — Cardiovascular Report (Signed)
[name], [name] NO.:  [id number]  MEDICAL RECORD NO.:  [id number]  LOCATION:  [facility]                         FACILITY:  [facility]  PHYSICIAN:  [name], M.D. DATE OF BIRTH:  1950-11-21  DATE OF PROCEDURE:  07/09/2013 DATE OF DISCHARGE:  07/09/2013                           CARDIAC CATHETERIZATION   PROCEDURES:  Left cardiac catheterization with selective left and right coronary angiography, visualization of saphenous vein graft, left internal mammary artery graft graft, left ventriculography via right groin using Judkins technique.  INDICATION FOR THE PROCEDURE:  [name] is a 63 year old male with past medical history significant for coronary artery disease, history of myocardial infarction in 1989, subsequently had CABG.  He had LIMA to LAD and saphenous vein graft to obtuse marginal 1.  Subsequently, also had MI in 2006, inferoposterior wall myocardial infarction in 2006. Subsequently, had PTCA stenting to saphenous vein graft to OM-1 and also had PCI to protected left main and left circumflex in December 2006, history of atrial flutter ablation in 2003, hypertension, hypercholesteremia, tobacco abuse, history of gouty arthritis, erectile dysfunction, was seen in my office, complaining of retrosternal chest pain off and on, lasting 10 to 15 minutes, relieved with rest.  States chest pain radiates to both shoulder associated with nausea.  Denies any vomiting, denies shortness of breath, denies palpitation, lightheadedness, or syncope.  The patient recently had [facility] on Jun 14, 2013, which showed inferolateral wall scarring with small amount of peri-infarct ischemia in the lateral wall with EF of 48%.  Due to typical anginal chest pain, multiple risk factors, abnormal stress test, discussed with the patient regarding left catheterization, possible PTCA stenting, its risks and benefits, i.e., death, MI, stroke, need for emergency CABG,  local vascular complications, etc., and consented for PCI.  DESCRIPTION OF PROCEDURE:  After obtaining the informed consent, the patient was brought to the cath lab and was placed on fluoroscopy table. Right groin was prepped and draped in usual fashion.  A 1% Xylocaine was used for local anesthesia in the right groin.  With the help of thin wall needle, 5-French arterial sheath was placed.  The sheath was aspirated and flushed.  Next, 5-French left Judkins catheter was advanced over the wire under fluoroscopic guidance up to the ascending aorta.  Wire was pulled out.  The catheter was aspirated and connected to the Manifold.  Catheter was further advanced and engaged into left coronary ostium.  Multiple views of the left system were taken.  Next, catheter was disengaged and was pulled out over the wire and was replaced with 5-French right Judkins catheter which was advanced over the wire under fluoroscopic guidance up to the ascending aorta.  Wire was pulled out.  The catheter was aspirated and connected to the Manifold.  Catheter was further advanced and engaged into right coronary ostium.  Multiple views of the right system were taken.  Next, catheter was disengaged and was engaged into saphenous vein graft to OM-1.  A single view of this graft was taken.  Next, catheter was disengaged and was engaged into LIMA to LAD.  Multiple views of this graft were taken. Next, the right Judkins catheter was  pulled out over the wire and was replaced with 5-French pigtail catheter which was advanced over the wire under fluoroscopic guidance up to the ascending aorta.  Catheter was further advanced across the aortic valve into the LV.  LV pressures were recorded.  Next, LV graft was done in 30-degree RAO position.  Post- angiographic pressures were recorded from LV and then pullback pressures were recorded from the aorta.  There was no gradient across the aortic valve.  Next, the pigtail catheter  was pulled out over the wire. Sheaths were aspirated and flushed.  FINDINGS:  LV showed good LV systolic function.  EF of 50% to 55%.  Left main was patent.  Distal stented segment in the left main had 10% to 15% stenosis, extending into the left circumflex ostium.  LAD was 100% occluded at the ostium as before.  Ramus has 99% ostial stenosis. Vessel is less than 1.25 mm, not suitable for PCI.  Left circumflex has 10% to 15% in-stent ostial and 20% to 25% distal edge stenosis with TIMI- 3 distal flow.  OM-1 is 100% occluded as before.  RCA has 10% to 15% mid stenosis.  PDA and PLV branches were patent.  Saphenous vein graft to obtuse marginal 1 is 100% occluded as before at the ostium.  LIMA to LAD is patent.  In the proximal LIMA, there is intercostal artery which was not ligated as before.  The patient tolerated the procedure well.  There were no complications.  The patient was transferred to the recovery room in stable condition.     [name]. [name], M.D.     MNH/MEDQ  D:  07/09/2013  T:  07/09/2013  Job:  [id number]

## 2013-07-09 NOTE — H&P (Signed)
    history and physicalin the chart needs to be scanned

## 2013-07-09 NOTE — Interval H&P Note (Signed)
Cath Lab Visit (complete for each Cath Lab visit)  Clinical Evaluation Leading to the Procedure:   ACS: no  Non-ACS:    Anginal Classification: CCS III  Anti-ischemic medical therapy: Maximal Therapy (2 or more classes of medications)  Non-Invasive Test Results: Low-risk stress test findings: cardiac mortality <1%/year  Prior CABG: Previous CABG      History and Physical Interval Note:  07/09/2013 9:17 AM  [name]  has presented today for surgery, with the diagnosis of Chest Pain  The various methods of treatment have been discussed with the patient and family. After consideration of risks, benefits and other options for treatment, the patient has consented to  Procedure(s): LEFT HEART CATHETERIZATION WITH CORONARY/GRAFT ANGIOGRAM (N/A) as a surgical intervention .  The patient's history has been reviewed, patient examined, no change in status, stable for surgery.  I have reviewed the patient's chart and labs.  Questions were answered to the patient's satisfaction.     Clent Demark

## 2013-07-09 NOTE — Discharge Instructions (Signed)
Angiogram, Care After  Refer to this sheet in the next few weeks. These instructions provide you with information on caring for yourself after your procedure. Your health care provider may also give you more specific instructions. Your treatment has been planned according to current medical practices, but problems sometimes occur. Call your health care provider if you have any problems or questions after your procedure.  WHAT TO EXPECT AFTER THE PROCEDURE After your procedure, it is typical to have the following sensations:  Minor discomfort or tenderness and a small bump at the catheter insertion site. The bump should usually decrease in size and tenderness within 1 to 2 weeks.  Any bruising will usually fade within 2 to 4 weeks. HOME CARE INSTRUCTIONS   You may need to keep taking blood thinners if they were prescribed for you. Only take over-the-counter or prescription medicines for pain, fever, or discomfort as directed by your health care provider.  Do not apply powder or lotion to the site.  Do not sit in a bathtub, swimming pool, or whirlpool for 5 to 7 days.  You may shower 24 hours after the procedure. Remove the bandage (dressing) and gently wash the site with plain soap and water. Gently pat the site dry.  Inspect the site at least twice daily.  Limit your activity for the first 48 hours. Do not bend, squat, or lift anything over 20 lb (9 kg) or as directed by your health care provider.  Do not drive home if you are discharged the day of the procedure. Have someone else drive you. Follow instructions about when you can drive or return to work. SEEK MEDICAL CARE IF:  You get lightheaded when standing up.  You have drainage (other than a small amount of blood on the dressing).  You have chills.  You have a fever.  You have redness, warmth, swelling, or pain at the insertion site. SEEK IMMEDIATE MEDICAL CARE IF:   You develop chest pain or shortness of breath, feel faint,  or pass out.  You have bleeding, swelling larger than a walnut, or drainage from the catheter insertion site.  You develop pain, discoloration, coldness, or severe bruising in the leg or arm that held the catheter.  You have heavy bleeding from the site. If this happens, hold pressure on the site. MAKE SURE YOU:  Understand these instructions.  Will watch your condition.  Will get help right away if you are not doing well or get worse. Document Released: 07/22/2004 Document Revised: 01/08/2013 Document Reviewed: 05/28/2012 [facility] Patient Information 2015 [place], [place]. This information is not intended to replace advice given to you by your health care provider. Make sure you discuss any questions you have with your health care provider.

## 2013-07-09 NOTE — CV Procedure (Signed)
Left cardiac cath report dictated on 07/09/2013 dictation number is [id number]

## 2013-12-26 ENCOUNTER — Encounter (HOSPITAL_COMMUNITY): Payer: Self-pay | Admitting: Cardiology

## 2014-01-20 DIAGNOSIS — I209 Angina pectoris, unspecified: Secondary | ICD-10-CM | POA: Diagnosis not present

## 2014-01-20 DIAGNOSIS — E785 Hyperlipidemia, unspecified: Secondary | ICD-10-CM | POA: Diagnosis not present

## 2014-01-20 DIAGNOSIS — F17290 Nicotine dependence, other tobacco product, uncomplicated: Secondary | ICD-10-CM | POA: Diagnosis not present

## 2014-01-20 DIAGNOSIS — I10 Essential (primary) hypertension: Secondary | ICD-10-CM | POA: Diagnosis not present

## 2014-01-20 DIAGNOSIS — I2510 Atherosclerotic heart disease of native coronary artery without angina pectoris: Secondary | ICD-10-CM | POA: Diagnosis not present

## 2014-03-24 DIAGNOSIS — I10 Essential (primary) hypertension: Secondary | ICD-10-CM | POA: Diagnosis not present

## 2014-03-24 DIAGNOSIS — I2510 Atherosclerotic heart disease of native coronary artery without angina pectoris: Secondary | ICD-10-CM | POA: Diagnosis not present

## 2014-03-24 DIAGNOSIS — F17290 Nicotine dependence, other tobacco product, uncomplicated: Secondary | ICD-10-CM | POA: Diagnosis not present

## 2014-03-24 DIAGNOSIS — E785 Hyperlipidemia, unspecified: Secondary | ICD-10-CM | POA: Diagnosis not present

## 2014-03-24 DIAGNOSIS — I209 Angina pectoris, unspecified: Secondary | ICD-10-CM | POA: Diagnosis not present

## 2014-04-17 ENCOUNTER — Telehealth: Payer: Self-pay | Admitting: Family Medicine

## 2014-06-25 DIAGNOSIS — Z720 Tobacco use: Secondary | ICD-10-CM | POA: Diagnosis not present

## 2014-06-25 DIAGNOSIS — F17290 Nicotine dependence, other tobacco product, uncomplicated: Secondary | ICD-10-CM | POA: Diagnosis not present

## 2014-06-25 DIAGNOSIS — I10 Essential (primary) hypertension: Secondary | ICD-10-CM | POA: Diagnosis not present

## 2014-06-25 DIAGNOSIS — I2510 Atherosclerotic heart disease of native coronary artery without angina pectoris: Secondary | ICD-10-CM | POA: Diagnosis not present

## 2014-06-25 DIAGNOSIS — E785 Hyperlipidemia, unspecified: Secondary | ICD-10-CM | POA: Diagnosis not present

## 2014-06-25 DIAGNOSIS — Z716 Tobacco abuse counseling: Secondary | ICD-10-CM | POA: Diagnosis not present

## 2014-06-25 DIAGNOSIS — I209 Angina pectoris, unspecified: Secondary | ICD-10-CM | POA: Diagnosis not present

## 2014-08-18 ENCOUNTER — Ambulatory Visit (INDEPENDENT_AMBULATORY_CARE_PROVIDER_SITE_OTHER): Payer: Medicare Other | Admitting: Family Medicine

## 2014-08-18 ENCOUNTER — Encounter: Payer: Self-pay | Admitting: Family Medicine

## 2014-08-18 VITALS — BP 147/85 | HR 74 | Temp 98.5°F | Resp 16 | Ht 72.0 in | Wt 193.0 lb

## 2014-08-18 DIAGNOSIS — R748 Abnormal levels of other serum enzymes: Secondary | ICD-10-CM

## 2014-08-18 DIAGNOSIS — E785 Hyperlipidemia, unspecified: Secondary | ICD-10-CM | POA: Diagnosis not present

## 2014-08-18 DIAGNOSIS — Z125 Encounter for screening for malignant neoplasm of prostate: Secondary | ICD-10-CM | POA: Diagnosis not present

## 2014-08-18 DIAGNOSIS — Z23 Encounter for immunization: Secondary | ICD-10-CM | POA: Diagnosis not present

## 2014-08-18 DIAGNOSIS — Z720 Tobacco use: Secondary | ICD-10-CM

## 2014-08-18 DIAGNOSIS — M25519 Pain in unspecified shoulder: Secondary | ICD-10-CM

## 2014-08-18 DIAGNOSIS — Z5181 Encounter for therapeutic drug level monitoring: Secondary | ICD-10-CM | POA: Diagnosis not present

## 2014-08-18 DIAGNOSIS — Z130 Encounter for screening for diseases of the blood and blood-forming organs and certain disorders involving the immune mechanism: Secondary | ICD-10-CM | POA: Diagnosis not present

## 2014-08-18 DIAGNOSIS — I252 Old myocardial infarction: Secondary | ICD-10-CM

## 2014-08-18 DIAGNOSIS — Z131 Encounter for screening for diabetes mellitus: Secondary | ICD-10-CM

## 2014-08-18 DIAGNOSIS — Z0000 Encounter for general adult medical examination without abnormal findings: Secondary | ICD-10-CM

## 2014-08-18 DIAGNOSIS — F17200 Nicotine dependence, unspecified, uncomplicated: Secondary | ICD-10-CM

## 2014-08-18 DIAGNOSIS — M109 Gout, unspecified: Secondary | ICD-10-CM | POA: Diagnosis not present

## 2014-08-18 DIAGNOSIS — S61401A Unspecified open wound of right hand, initial encounter: Secondary | ICD-10-CM

## 2014-08-18 LAB — COMPREHENSIVE METABOLIC PANEL
ALT: 51 U/L — ABNORMAL HIGH (ref 9–46)
AST: 53 U/L — AB (ref 10–35)
Albumin: 4.1 g/dL (ref 3.6–5.1)
Alkaline Phosphatase: 86 U/L (ref 40–115)
BUN: 15 mg/dL (ref 7–25)
CO2: 27 mmol/L (ref 20–31)
Calcium: 9.9 mg/dL (ref 8.6–10.3)
Chloride: 102 mmol/L (ref 98–110)
Creat: 1.18 mg/dL (ref 0.70–1.25)
GLUCOSE: 83 mg/dL (ref 65–99)
Potassium: 4.6 mmol/L (ref 3.5–5.3)
Sodium: 140 mmol/L (ref 135–146)
TOTAL PROTEIN: 7.6 g/dL (ref 6.1–8.1)
Total Bilirubin: 0.5 mg/dL (ref 0.2–1.2)

## 2014-08-18 LAB — LIPID PANEL
CHOL/HDL RATIO: 4.7 ratio (ref <=5.0)
CHOLESTEROL: 267 mg/dL — AB (ref 125–200)
HDL: 57 mg/dL (ref >=40)
LDL Cholesterol: 175 mg/dL — ABNORMAL HIGH (ref <130)
TRIGLYCERIDES: 175 mg/dL — AB (ref <150)
VLDL: 35 mg/dL — ABNORMAL HIGH (ref <30)

## 2014-08-18 LAB — CBC
HCT: 43.7 % (ref 39.0–52.0)
Hemoglobin: 14.6 g/dL (ref 13.0–17.0)
MCH: 30.6 pg (ref 26.0–34.0)
MCHC: 33.4 g/dL (ref 30.0–36.0)
MCV: 91.6 fL (ref 78.0–100.0)
MPV: 10.4 fL (ref 8.6–12.4)
Platelets: 241 10*3/uL (ref 150–400)
RBC: 4.77 MIL/uL (ref 4.22–5.81)
RDW: 18.3 % — AB (ref 11.5–15.5)
WBC: 7.8 10*3/uL (ref 4.0–10.5)

## 2014-08-18 LAB — URIC ACID: Uric Acid, Serum: 4.5 mg/dL (ref 4.0–7.8)

## 2014-08-18 MED ORDER — ATORVASTATIN CALCIUM 40 MG PO TABS: 40.0000 mg | ORAL_TABLET | Freq: Every day | ORAL | Status: AC

## 2014-08-18 NOTE — Progress Notes (Signed)
[facility] [address] [place] [address] [phone]  Date:  08/18/2014   Name:  [name]   DOB:  18-Nov-1950   MRN:  [id number]  PCP:  Charolette Forward, MD    Chief Complaint: Annual Exam   History of Present Illness:  [name] is a 64 y.o. very pleasant male patient who presents with the following:  He has generally seen [place] for gout in the past, would like to have a CPE today He sees Dr. Terrence Dupont for his heart- he has his own independent practice.  He had bypass surgery in 1989, he has had "2 or 3 stents done" since then.  His last cath was one year ago which looked ok He reports home BP generally 130/80s.  He was up late last night and thinks this may be why his BP is up  BP Readings from Last 3 Encounters:  08/18/14 147/85  07/09/13 135/75  06/14/13 140/77    He takes plavix for CAD He takes allopurinal for gout prophylaxis; this is working well for him since he increased his dose He is fasting today for labs  He has not had any vaccines as of late- would like to catch up on these.  He has sustained a scratch on his right hand recently so can have a tetanus.  He prefers the Td for cost, no contact with infants  He does still smoke- about 1/3 PPD.  He knows that he needs to quit but does not desire any medication to help with this today.  He has cut back  EKG is in chart He is a retired English as a second language teacher, married, wife Deloris   There are no active problems to display for this patient.   Past Medical History  Diagnosis Date  . Depression   . Myocardial infarction   . Hypertension   . Allergy   . Anemia   . Anxiety   . Arthritis   . Cataract     Past Surgical History  Procedure Laterality Date  . Left heart catheterization with coronary/graft angiogram N/A 07/09/2013    Procedure: LEFT HEART CATHETERIZATION WITH Beatrix Fetters;  Surgeon: Clent Demark, MD;  Location: [facility] CATH LAB;  Service: Cardiovascular;  Laterality: N/A;   . Eye surgery    . Fracture surgery    . Coronary artery bypass graft      History  Substance Use Topics  . Smoking status: Current Every Day Smoker -- 0.40 packs/day    Types: Cigarettes  . Smokeless tobacco: Not on file  . Alcohol Use: Yes    Family History  Problem Relation Age of Onset  . Diabetes Mother   . Cancer Father   . Hypertension Father   . Stroke Father     Allergies  Allergen Reactions  . Shellfish Allergy Diarrhea and Nausea And Vomiting    Oysters only     Medication list has been reviewed and updated.  Current Outpatient Prescriptions on File Prior to Visit  Medication Sig Dispense Refill  . allopurinol (ZYLOPRIM) 300 MG tablet Take 300 mg by mouth daily.    Marland Kitchen aspirin EC 81 MG tablet Take 81 mg by mouth daily.    Marland Kitchen atorvastatin (LIPITOR) 40 MG tablet Take 40 mg by mouth daily.    . clopidogrel (PLAVIX) 75 MG tablet Take 75 mg by mouth daily with breakfast.    . metoprolol succinate (TOPROL-XL) 100 MG 24 hr tablet Take 100 mg by mouth  daily. Take with or immediately following a meal.    . ramipril (ALTACE) 10 MG capsule Take 10 mg by mouth daily.     No current facility-administered medications on file prior to visit.    Review of Systems:  As per HPI- otherwise negative.   Physical Examination: Filed Vitals:   08/18/14 1004  BP: 147/85  Pulse: 74  Temp: 98.5 F (36.9 C)  Resp: 16   Filed Vitals:   08/18/14 1004  Height: 6' (1.829 m)  Weight: 193 lb (87.544 kg)   Body mass index is 26.17 kg/(m^2). Ideal Body Weight: Weight in (lb) to have BMI = 25: 183.9  GEN: WDWN, NAD, Non-toxic, A & O x 3, looks well, mild overweight HEENT: Atraumatic, Normocephalic. Neck supple. No masses, No LAD. Ears and Nose: No external deformity. CV: RRR, No M/G/R. No JVD. No thrill. No extra heart sounds. PULM: CTA B, no wheezes, crackles, rhonchi. No retractions. No resp. distress. No accessory muscle use. ABD: S, NT, ND. No rebound. No HSM. EXTR: No  c/c/e NEURO Normal gait.  PSYCH: Normally interactive. Conversant. Not depressed or anxious appearing.  Calm demeanor.  Normal penis and testes, DRE normal He notes some stiffness in his bilateral shoulders.  Mild restriction of shoulder abduction bilaterally   Assessment and Plan: Physical exam  Hyperlipidemia - Plan: atorvastatin (LIPITOR) 40 MG tablet, Lipid panel  Gout of multiple sites, unspecified cause, unspecified chronicity - Plan: Uric Acid  Open wound of right hand, initial encounter - Plan: Td vaccine greater than or equal to 7yo preservative free IM  Screening for prostate cancer - Plan: PSA, Medicare  Screening for deficiency anemia - Plan: CBC  Immunization due - Plan: Pneumococcal conjugate vaccine 13-valent IM  Screening for diabetes mellitus - Plan: Comprehensive metabolic panel  Medication monitoring encounter - Plan: CBC  Tobacco use disorder  History of MI (myocardial infarction)  Pain in joint, shoulder region, unspecified laterality - Plan: Ambulatory referral to Physical Therapy  Labs pending See pt instructions  Signed Lamar Blinks, MD

## 2014-08-18 NOTE — Patient Instructions (Addendum)
I will be in touch with your labs asap Please work on quitting smoking entirely!   It would also be helpful to lose about 10 bs to reach your ideal body weight You got your prevnar (pneumonia vaccine) and tetanus shot today I refilled your cholesterol medication Take care!  It was nice to see you today We will get you referred to PT about your shoulders

## 2014-08-19 LAB — PSA, MEDICARE: PSA: 0.88 ng/mL (ref <=4.00)

## 2014-08-21 ENCOUNTER — Encounter: Payer: Self-pay | Admitting: Family Medicine

## 2014-08-21 NOTE — Addendum Note (Signed)
Addended by: Lamar Blinks C on: 08/21/2014 02:28 PM   Modules accepted: Orders

## 2014-10-16 DIAGNOSIS — F17290 Nicotine dependence, other tobacco product, uncomplicated: Secondary | ICD-10-CM | POA: Diagnosis not present

## 2014-10-16 DIAGNOSIS — E785 Hyperlipidemia, unspecified: Secondary | ICD-10-CM | POA: Diagnosis not present

## 2014-10-16 DIAGNOSIS — I10 Essential (primary) hypertension: Secondary | ICD-10-CM | POA: Diagnosis not present

## 2014-10-16 DIAGNOSIS — I2510 Atherosclerotic heart disease of native coronary artery without angina pectoris: Secondary | ICD-10-CM | POA: Diagnosis not present

## 2014-10-16 DIAGNOSIS — I209 Angina pectoris, unspecified: Secondary | ICD-10-CM | POA: Diagnosis not present

## 2015-01-15 DIAGNOSIS — I10 Essential (primary) hypertension: Secondary | ICD-10-CM | POA: Diagnosis not present

## 2015-01-15 DIAGNOSIS — E785 Hyperlipidemia, unspecified: Secondary | ICD-10-CM | POA: Diagnosis not present

## 2015-01-15 DIAGNOSIS — F17290 Nicotine dependence, other tobacco product, uncomplicated: Secondary | ICD-10-CM | POA: Diagnosis not present

## 2015-01-15 DIAGNOSIS — I2510 Atherosclerotic heart disease of native coronary artery without angina pectoris: Secondary | ICD-10-CM | POA: Diagnosis not present

## 2015-01-15 DIAGNOSIS — I209 Angina pectoris, unspecified: Secondary | ICD-10-CM | POA: Diagnosis not present

## 2015-04-16 DIAGNOSIS — I10 Essential (primary) hypertension: Secondary | ICD-10-CM | POA: Diagnosis not present

## 2015-04-16 DIAGNOSIS — F17290 Nicotine dependence, other tobacco product, uncomplicated: Secondary | ICD-10-CM | POA: Diagnosis not present

## 2015-04-16 DIAGNOSIS — E785 Hyperlipidemia, unspecified: Secondary | ICD-10-CM | POA: Diagnosis not present

## 2015-04-16 DIAGNOSIS — I25118 Atherosclerotic heart disease of native coronary artery with other forms of angina pectoris: Secondary | ICD-10-CM | POA: Diagnosis not present

## 2015-05-13 DIAGNOSIS — I2510 Atherosclerotic heart disease of native coronary artery without angina pectoris: Secondary | ICD-10-CM | POA: Diagnosis not present

## 2015-05-13 DIAGNOSIS — I10 Essential (primary) hypertension: Secondary | ICD-10-CM | POA: Diagnosis not present

## 2015-05-13 DIAGNOSIS — E785 Hyperlipidemia, unspecified: Secondary | ICD-10-CM | POA: Diagnosis not present

## 2015-05-18 DIAGNOSIS — F17290 Nicotine dependence, other tobacco product, uncomplicated: Secondary | ICD-10-CM | POA: Diagnosis not present

## 2015-05-18 DIAGNOSIS — E785 Hyperlipidemia, unspecified: Secondary | ICD-10-CM | POA: Diagnosis not present

## 2015-05-18 DIAGNOSIS — I25118 Atherosclerotic heart disease of native coronary artery with other forms of angina pectoris: Secondary | ICD-10-CM | POA: Diagnosis not present

## 2015-05-18 DIAGNOSIS — I10 Essential (primary) hypertension: Secondary | ICD-10-CM | POA: Diagnosis not present

## 2015-08-20 DIAGNOSIS — Z23 Encounter for immunization: Secondary | ICD-10-CM | POA: Diagnosis not present

## 2015-09-25 DIAGNOSIS — F17290 Nicotine dependence, other tobacco product, uncomplicated: Secondary | ICD-10-CM | POA: Diagnosis not present

## 2015-09-25 DIAGNOSIS — Z716 Tobacco abuse counseling: Secondary | ICD-10-CM | POA: Diagnosis not present

## 2015-09-25 DIAGNOSIS — I10 Essential (primary) hypertension: Secondary | ICD-10-CM | POA: Diagnosis not present

## 2015-09-25 DIAGNOSIS — E785 Hyperlipidemia, unspecified: Secondary | ICD-10-CM | POA: Diagnosis not present

## 2015-09-25 DIAGNOSIS — Z720 Tobacco use: Secondary | ICD-10-CM | POA: Diagnosis not present

## 2015-09-25 DIAGNOSIS — I25118 Atherosclerotic heart disease of native coronary artery with other forms of angina pectoris: Secondary | ICD-10-CM | POA: Diagnosis not present

## 2015-09-25 DIAGNOSIS — Z951 Presence of aortocoronary bypass graft: Secondary | ICD-10-CM | POA: Diagnosis not present

## 2015-11-03 DIAGNOSIS — Z23 Encounter for immunization: Secondary | ICD-10-CM | POA: Diagnosis not present

## 2015-12-30 DIAGNOSIS — I10 Essential (primary) hypertension: Secondary | ICD-10-CM | POA: Diagnosis not present

## 2015-12-30 DIAGNOSIS — Z951 Presence of aortocoronary bypass graft: Secondary | ICD-10-CM | POA: Diagnosis not present

## 2015-12-30 DIAGNOSIS — I25118 Atherosclerotic heart disease of native coronary artery with other forms of angina pectoris: Secondary | ICD-10-CM | POA: Diagnosis not present

## 2015-12-30 DIAGNOSIS — E785 Hyperlipidemia, unspecified: Secondary | ICD-10-CM | POA: Diagnosis not present

## 2015-12-30 DIAGNOSIS — F17290 Nicotine dependence, other tobacco product, uncomplicated: Secondary | ICD-10-CM | POA: Diagnosis not present

## 2016-03-24 DIAGNOSIS — Z86010 Personal history of colonic polyps: Secondary | ICD-10-CM | POA: Diagnosis not present

## 2016-03-24 DIAGNOSIS — Z1211 Encounter for screening for malignant neoplasm of colon: Secondary | ICD-10-CM | POA: Diagnosis not present

## 2016-03-24 DIAGNOSIS — K5901 Slow transit constipation: Secondary | ICD-10-CM | POA: Diagnosis not present

## 2016-04-08 DIAGNOSIS — K621 Rectal polyp: Secondary | ICD-10-CM | POA: Diagnosis not present

## 2016-04-08 DIAGNOSIS — D122 Benign neoplasm of ascending colon: Secondary | ICD-10-CM | POA: Diagnosis not present

## 2016-04-08 DIAGNOSIS — D125 Benign neoplasm of sigmoid colon: Secondary | ICD-10-CM | POA: Diagnosis not present

## 2016-04-08 DIAGNOSIS — K635 Polyp of colon: Secondary | ICD-10-CM | POA: Diagnosis not present

## 2016-04-08 DIAGNOSIS — D128 Benign neoplasm of rectum: Secondary | ICD-10-CM | POA: Diagnosis not present

## 2016-04-08 DIAGNOSIS — Z1211 Encounter for screening for malignant neoplasm of colon: Secondary | ICD-10-CM | POA: Diagnosis not present

## 2016-04-08 DIAGNOSIS — D120 Benign neoplasm of cecum: Secondary | ICD-10-CM | POA: Diagnosis not present

## 2016-04-12 ENCOUNTER — Ambulatory Visit (HOSPITAL_COMMUNITY): Payer: Self-pay | Admitting: Psychiatry

## 2016-04-21 DIAGNOSIS — E785 Hyperlipidemia, unspecified: Secondary | ICD-10-CM | POA: Diagnosis not present

## 2016-04-21 DIAGNOSIS — I10 Essential (primary) hypertension: Secondary | ICD-10-CM | POA: Diagnosis not present

## 2016-04-21 DIAGNOSIS — I25118 Atherosclerotic heart disease of native coronary artery with other forms of angina pectoris: Secondary | ICD-10-CM | POA: Diagnosis not present

## 2016-04-21 DIAGNOSIS — F17290 Nicotine dependence, other tobacco product, uncomplicated: Secondary | ICD-10-CM | POA: Diagnosis not present

## 2016-04-21 DIAGNOSIS — Z951 Presence of aortocoronary bypass graft: Secondary | ICD-10-CM | POA: Diagnosis not present

## 2016-06-16 DIAGNOSIS — H2513 Age-related nuclear cataract, bilateral: Secondary | ICD-10-CM | POA: Diagnosis not present

## 2016-06-16 DIAGNOSIS — H40033 Anatomical narrow angle, bilateral: Secondary | ICD-10-CM | POA: Diagnosis not present

## 2016-07-26 DIAGNOSIS — I10 Essential (primary) hypertension: Secondary | ICD-10-CM | POA: Diagnosis not present

## 2016-07-26 DIAGNOSIS — I2510 Atherosclerotic heart disease of native coronary artery without angina pectoris: Secondary | ICD-10-CM | POA: Diagnosis not present

## 2016-07-26 DIAGNOSIS — E785 Hyperlipidemia, unspecified: Secondary | ICD-10-CM | POA: Diagnosis not present

## 2016-07-28 ENCOUNTER — Other Ambulatory Visit: Payer: Self-pay | Admitting: Cardiology

## 2016-07-28 DIAGNOSIS — E785 Hyperlipidemia, unspecified: Secondary | ICD-10-CM | POA: Diagnosis not present

## 2016-07-28 DIAGNOSIS — I25110 Atherosclerotic heart disease of native coronary artery with unstable angina pectoris: Secondary | ICD-10-CM | POA: Diagnosis not present

## 2016-07-28 DIAGNOSIS — I10 Essential (primary) hypertension: Secondary | ICD-10-CM | POA: Diagnosis not present

## 2016-07-28 DIAGNOSIS — F17290 Nicotine dependence, other tobacco product, uncomplicated: Secondary | ICD-10-CM | POA: Diagnosis not present

## 2016-07-28 DIAGNOSIS — R079 Chest pain, unspecified: Secondary | ICD-10-CM

## 2016-07-28 DIAGNOSIS — Z951 Presence of aortocoronary bypass graft: Secondary | ICD-10-CM | POA: Diagnosis not present

## 2016-08-10 ENCOUNTER — Ambulatory Visit (HOSPITAL_COMMUNITY)
Admission: RE | Admit: 2016-08-10 | Discharge: 2016-08-10 | Disposition: A | Discharge status: Home / Self Care | Payer: Medicare Other | Source: Ambulatory Visit | Attending: Cardiology | Admitting: Cardiology

## 2016-08-10 ENCOUNTER — Encounter (HOSPITAL_COMMUNITY): Payer: Self-pay

## 2016-08-10 DIAGNOSIS — F17290 Nicotine dependence, other tobacco product, uncomplicated: Secondary | ICD-10-CM | POA: Diagnosis not present

## 2016-08-10 DIAGNOSIS — I517 Cardiomegaly: Secondary | ICD-10-CM | POA: Diagnosis not present

## 2016-08-10 DIAGNOSIS — I25110 Atherosclerotic heart disease of native coronary artery with unstable angina pectoris: Secondary | ICD-10-CM | POA: Diagnosis not present

## 2016-08-10 DIAGNOSIS — Z951 Presence of aortocoronary bypass graft: Secondary | ICD-10-CM | POA: Diagnosis not present

## 2016-08-10 DIAGNOSIS — R938 Abnormal findings on diagnostic imaging of other specified body structures: Secondary | ICD-10-CM | POA: Diagnosis not present

## 2016-08-10 DIAGNOSIS — E785 Hyperlipidemia, unspecified: Secondary | ICD-10-CM | POA: Diagnosis not present

## 2016-08-10 DIAGNOSIS — R079 Chest pain, unspecified: Secondary | ICD-10-CM

## 2016-08-10 DIAGNOSIS — R9431 Abnormal electrocardiogram [ECG] [EKG]: Secondary | ICD-10-CM | POA: Diagnosis not present

## 2016-08-10 DIAGNOSIS — I10 Essential (primary) hypertension: Secondary | ICD-10-CM | POA: Diagnosis not present

## 2016-08-10 MED ORDER — REGADENOSON 0.4 MG/5ML IV SOLN
0.4000 mg | Freq: Once | INTRAVENOUS | Status: AC
  Administered 2016-08-10: 0.4 mg via INTRAVENOUS

## 2016-08-10 MED ORDER — TECHNETIUM TC 99M TETROFOSMIN IV KIT
30.0000 | PACK | Freq: Once | INTRAVENOUS | Status: AC | PRN
  Administered 2016-08-10: 30 via INTRAVENOUS

## 2016-08-10 MED ORDER — TECHNETIUM TC 99M TETROFOSMIN IV KIT
11.0000 | PACK | Freq: Once | INTRAVENOUS | Status: AC | PRN
  Administered 2016-08-10: 11 via INTRAVENOUS

## 2016-08-10 MED ORDER — REGADENOSON 0.4 MG/5ML IV SOLN
INTRAVENOUS | Status: AC
  Administered 2016-08-10: 0.4 mg via INTRAVENOUS
  Filled 2016-08-10: qty 5

## 2016-12-02 DIAGNOSIS — Z23 Encounter for immunization: Secondary | ICD-10-CM | POA: Diagnosis not present

## 2016-12-12 ENCOUNTER — Other Ambulatory Visit: Payer: Self-pay

## 2016-12-12 ENCOUNTER — Ambulatory Visit (INDEPENDENT_AMBULATORY_CARE_PROVIDER_SITE_OTHER): Payer: Medicare Other | Admitting: Family Medicine

## 2016-12-12 ENCOUNTER — Encounter: Payer: Self-pay | Admitting: Family Medicine

## 2016-12-12 VITALS — BP 148/102 | HR 126 | Temp 98.4°F | Resp 18 | Ht 72.0 in | Wt 182.8 lb

## 2016-12-12 DIAGNOSIS — L600 Ingrowing nail: Secondary | ICD-10-CM

## 2016-12-12 DIAGNOSIS — B351 Tinea unguium: Secondary | ICD-10-CM | POA: Diagnosis not present

## 2016-12-12 DIAGNOSIS — I25810 Atherosclerosis of coronary artery bypass graft(s) without angina pectoris: Secondary | ICD-10-CM

## 2016-12-12 DIAGNOSIS — F1020 Alcohol dependence, uncomplicated: Secondary | ICD-10-CM

## 2016-12-12 DIAGNOSIS — E782 Mixed hyperlipidemia: Secondary | ICD-10-CM | POA: Diagnosis not present

## 2016-12-12 DIAGNOSIS — I10 Essential (primary) hypertension: Secondary | ICD-10-CM

## 2016-12-12 DIAGNOSIS — M109 Gout, unspecified: Secondary | ICD-10-CM

## 2016-12-12 DIAGNOSIS — I252 Old myocardial infarction: Secondary | ICD-10-CM

## 2016-12-12 DIAGNOSIS — F17200 Nicotine dependence, unspecified, uncomplicated: Secondary | ICD-10-CM | POA: Diagnosis not present

## 2016-12-12 LAB — LIPID PANEL
Chol/HDL Ratio: 2.3 ratio (ref 0.0–5.0)
Cholesterol, Total: 253 mg/dL — ABNORMAL HIGH (ref 100–199)
HDL: 108 mg/dL (ref >39)
LDL Calculated: 125 mg/dL — ABNORMAL HIGH (ref 0–99)
Triglycerides: 102 mg/dL (ref 0–149)
VLDL Cholesterol Cal: 20 mg/dL (ref 5–40)

## 2016-12-12 LAB — COMPREHENSIVE METABOLIC PANEL
ALBUMIN: 4 g/dL (ref 3.6–4.8)
ALT: 52 IU/L — AB (ref 0–44)
AST: 81 IU/L — ABNORMAL HIGH (ref 0–40)
Albumin/Globulin Ratio: 1.1 — ABNORMAL LOW (ref 1.2–2.2)
Alkaline Phosphatase: 92 IU/L (ref 39–117)
BILIRUBIN TOTAL: 0.4 mg/dL (ref 0.0–1.2)
BUN / CREAT RATIO: 6 — AB (ref 10–24)
BUN: 6 mg/dL — AB (ref 8–27)
CALCIUM: 9.5 mg/dL (ref 8.6–10.2)
CHLORIDE: 101 mmol/L (ref 96–106)
CO2: 25 mmol/L (ref 20–29)
Creatinine, Ser: 0.99 mg/dL (ref 0.76–1.27)
GFR calc non Af Amer: 78 mL/min/{1.73_m2} (ref >59)
GFR, EST AFRICAN AMERICAN: 91 mL/min/{1.73_m2} (ref >59)
GLUCOSE: 81 mg/dL (ref 65–99)
Globulin, Total: 3.7 g/dL (ref 1.5–4.5)
Potassium: 4.8 mmol/L (ref 3.5–5.2)
Sodium: 145 mmol/L — ABNORMAL HIGH (ref 134–144)
TOTAL PROTEIN: 7.7 g/dL (ref 6.0–8.5)

## 2016-12-12 NOTE — Patient Instructions (Addendum)
   IF you received an x-ray today, you will receive an invoice from [facility]. Please contact [facility] at [phone] with questions or concerns regarding your invoice.   IF you received labwork today, you will receive an invoice from [facility]. Please contact [facility] at [phone] with questions or concerns regarding your invoice.   Our billing staff will not be able to assist you with questions regarding bills from these companies.  You will be contacted with the lab results as soon as they are available. The fastest way to get your results is to activate your My Chart account. Instructions are located on the last page of this paperwork. If you have not heard from us regarding the results in 2 weeks, please contact this office.     Fungal Nail Infection Fungal nail infection is a common fungal infection of the toenails or fingernails. This condition affects toenails more often than fingernails. More than one nail may be infected. The condition can be passed from person to person (is contagious). What are the causes? This condition is caused by a fungus. Several types of funguses can cause the infection. These funguses are common in moist and warm areas. If your hands or feet come into contact with the fungus, it may get into a crack in your fingernail or toenail and cause the infection. What increases the risk? The following factors may make you more likely to develop this condition:  Being male.  Having diabetes.  Being of older age.  Living with someone who has the fungus.  Walking barefoot in areas where the fungus thrives, such as showers or locker rooms.  Having poor circulation.  Wearing shoes and socks that cause your feet to sweat.  Having athlete's foot.  Having a nail injury or history of a recent nail surgery.  Having psoriasis.  Having a weak body defense system (immune system).  What are the signs or symptoms? Symptoms of this  condition include:  A pale spot on the nail.  Thickening of the nail.  A nail that becomes yellow or brown.  A brittle or ragged nail edge.  A crumbling nail.  A nail that has lifted away from the nail bed.  How is this diagnosed? This condition is diagnosed with a physical exam. Your health care provider may take a scraping or clipping from your nail to test for the fungus. How is this treated? Mild infections do not need treatment. If you have significant nail changes, treatment may include:  Oral antifungal medicines. You may need to take the medicine for several weeks or several months, and you may not see the results for a long time. These medicines can cause side effects. Ask your health care provider what problems to watch for.  Antifungal nail polish and nail cream. These may be used along with oral antifungal medicines.  Laser treatment of the nail.  Surgery to remove the nail. This may be needed for the most severe infections.  Treatment takes a long time, and the infection may come back. Follow these instructions at home: Medicines  Take or apply over-the-counter and prescription medicines only as told by your health care provider.  Ask your health care provider about using over-the-counter mentholated ointment on your nails. Lifestyle   Do not share personal items, such as towels or nail clippers.  Trim your nails often.  Wash and dry your hands and feet every day.  Wear absorbent socks, and change your socks frequently.  Wear shoes that   allow air to circulate, such as sandals or canvas tennis shoes. Throw out old shoes.  Wear rubber gloves if you are working with your hands in wet areas.  Do not walk barefoot in shower rooms or locker rooms.  Do not use a nail salon that does not use clean instruments.  Do not use artificial nails. General instructions  Keep all follow-up visits as told by your health care provider. This is important.  Use  antifungal foot powder on your feet and in your shoes. Contact a health care provider if: Your infection is not getting better or it is getting worse after several months. This information is not intended to replace advice given to you by your health care provider. Make sure you discuss any questions you have with your health care provider. Document Released: 01/01/2000 Document Revised: 06/11/2015 Document Reviewed: 07/07/2014 Elsevier Interactive Patient Education  2018 Elsevier Inc.  

## 2016-12-12 NOTE — Progress Notes (Signed)
Chief Complaint  Patient presents with  . Toe Pain    right foot, big toe,x4 month, pt thought pain was gout but pain is worse and sore to the touch with swelling.    HPI Heavy Alcohol Use Noncompliance Uncontrolled Hypertension Pt reports that he was drinking alcohol over the weekend since Thursday Since he was drinking he did not have any of his daily meds He has not taken his allopurinol, toprol, plavix, lipitor, asa, ramipril He DENIES - Chest pains, shortness of breath, palpitations He did not eat breakfast this morning so he did not take any of his daily meds He reports that he takes his bp meds on most days He reports beer daily about 4 beers a day He also has some wine at times with his beer If he is working in the yard he drinks a 6 pack  Tobacco use CAD s/p MI He reports that he continues to smoke cigarettes  He states that he still drinks liquor He had a heart attack at age 42 He reports that he smokes 1 pack a day He states that he does not know how to quit. He smokes when he drinks a beer, when he works in the yard, after he eats and with friends. He started in college and has been smoking a pack a day since so that is about 43 years that he has smoked   EF 30% 2003 EKG 2015 -  ST and t wave changes, QT prolongation, 1st degree AV block  Toe nail fungus He reports that he has 4 month history of great toe nail discoloration and pain around the nail  The toe is on the right foot He has not been seen for this He states that he takes his allopurinol and the toe is not swollen and there is not joint pain He tried topical antifungals without improvement.     Past Medical History:  Diagnosis Date  . Allergy   . Anemia   . Anxiety   . Arthritis   . Cataract   . Depression   . Hypertension   . Myocardial infarction [facility])     Current Outpatient Medications  Medication Sig Dispense Refill  . allopurinol (ZYLOPRIM) 300 MG tablet Take 300 mg by mouth daily.     Marland Kitchen aspirin EC 81 MG tablet Take 81 mg by mouth daily.    Marland Kitchen atorvastatin (LIPITOR) 40 MG tablet Take 1 tablet (40 mg total) by mouth daily. 90 tablet 3  . clopidogrel (PLAVIX) 75 MG tablet Take 75 mg by mouth daily with breakfast.    . metoprolol succinate (TOPROL-XL) 100 MG 24 hr tablet Take 100 mg by mouth daily. Take with or immediately following a meal.    . ramipril (ALTACE) 10 MG capsule Take 10 mg by mouth daily.     No current facility-administered medications for this visit.     Allergies:  Allergies  Allergen Reactions  . Shellfish Allergy Diarrhea and Nausea And Vomiting    Oysters only     Past Surgical History:  Procedure Laterality Date  . CORONARY ARTERY BYPASS GRAFT    . EYE SURGERY    . FRACTURE SURGERY    . LEFT HEART CATHETERIZATION WITH CORONARY/GRAFT ANGIOGRAM N/A 07/09/2013   Procedure: LEFT HEART CATHETERIZATION WITH Beatrix Fetters;  Surgeon: Clent Demark, MD;  Location: [facility] C[facility] LAB;  Service: Cardiovascular;  Laterality: N/A;    Social History   Socioeconomic History  . Marital status: Married  Spouse name: None  . Number of children: None  . Years of education: None  . Highest education level: None  Social Needs  . Financial resource strain: None  . Food insecurity - worry: None  . Food insecurity - inability: None  . Transportation needs - medical: None  . Transportation needs - non-medical: None  Occupational History  . None  Tobacco Use  . Smoking status: Current Every Day Smoker    Packs/day: 0.40    Types: Cigarettes  . Smokeless tobacco: Never Used  Substance and Sexual Activity  . Alcohol use: Yes  . Drug use: No  . Sexual activity: Yes  Other Topics Concern  . None  Social History Narrative  . None    Family History  Problem Relation Age of Onset  . Diabetes Mother   . Cancer Father   . Hypertension Father   . Stroke Father      ROS Review of Systems See HPI Constitution: No fevers or chills No  malaise No diaphoresis Skin: No rash or itching Eyes: no blurry vision, no double vision GU: no dysuria or hematuria Neuro: no dizziness or headaches all others reviewed and negative   Objective: Vitals:   12/12/16 0953  BP: (!) 148/102  Pulse: (!) 126  Resp: 18  Temp: 98.4 F (36.9 C)  TempSrc: Oral  SpO2: 98%  Weight: 182 lb 12.8 oz (82.9 kg)  Height: 6' (1.829 m)    Physical Exam  Constitutional: He is oriented to person, place, and time. He appears well-developed and well-nourished.  HENT:  Head: Normocephalic and atraumatic.  Nose: Nose normal.  Mouth/Throat: Oropharynx is clear and moist.  Eyes: Conjunctivae and EOM are normal.  Cardiovascular: Normal rate, regular rhythm and normal heart sounds.  No murmur heard. Pulmonary/Chest: Effort normal and breath sounds normal. No stridor. No respiratory distress. He has no wheezes.  Neurological: He is alert and oriented to person, place, and time.  Skin: Skin is warm. Capillary refill takes less than 2 seconds.   Great toe on right foot Ingrown toenail with tenderness at the cuticle edges  No fluctuance No effusion Normal cap refill  ecg 12/12/16 - persistent 1st degree av block, continued st depression in the lateral leads   Assessment and Plan Aydenn was seen today for toe pain.  Diagnoses and all orders for this visit: Tobacco use disorder History of MI (myocardial infarction)Coronary artery disease involving coronary bypass graft of native heart without angina pectoris History of heart attack -     EKG 12-Lead  Checked ecg Counseled pt on risk reduction He would like to quit. He will return to clinic to discuss other strategies but declined chantix as it make him feel bizarre in the past when he took it Discussed hypnosis and zyban  Gout of multiple sites, unspecified cause, unspecified chronicity- stable, continue allopurinol  Mixed hyperlipidemia- will check levels -     Lipid panel  Onychomycosis-  nail discolored Given his abnormal ast/alt and his ingrown nail referral placed for podiatry -     Ambulatory referral to Podiatry -     Comprehensive metabolic panel  Alcohol dependence, daily use ([place])- discussed cutting down and getting support from [place] for quitting He does not believe he has an addiction at this point.  Uncontrolled hypertension - pt to resume home bp meds Currently asymptomatic  -     Comprehensive metabolic panel  Ingrown toenail- will check liver enzymes and refer to Podiatry -  Ambulatory referral to Podiatry -     Comprehensive metabolic panel   A total of 45 minutes were spent face-to-face with the patient during this encounter and over half of that time was spent on counseling and coordination of care.   [place]

## 2016-12-13 DIAGNOSIS — B351 Tinea unguium: Secondary | ICD-10-CM | POA: Insufficient documentation

## 2016-12-13 DIAGNOSIS — F1020 Alcohol dependence, uncomplicated: Secondary | ICD-10-CM | POA: Insufficient documentation

## 2016-12-13 DIAGNOSIS — L600 Ingrowing nail: Secondary | ICD-10-CM | POA: Insufficient documentation

## 2016-12-13 DIAGNOSIS — I10 Essential (primary) hypertension: Secondary | ICD-10-CM | POA: Insufficient documentation

## 2016-12-13 NOTE — Progress Notes (Signed)
Lab result faxed per [facility] request to [facility]. Spoke to patient relayed message written by Dr.Stallings. Pt is aware of his appt for tomorrow.

## 2016-12-14 DIAGNOSIS — F17290 Nicotine dependence, other tobacco product, uncomplicated: Secondary | ICD-10-CM | POA: Diagnosis not present

## 2016-12-14 DIAGNOSIS — Z951 Presence of aortocoronary bypass graft: Secondary | ICD-10-CM | POA: Diagnosis not present

## 2016-12-14 DIAGNOSIS — I10 Essential (primary) hypertension: Secondary | ICD-10-CM | POA: Diagnosis not present

## 2016-12-14 DIAGNOSIS — I4891 Unspecified atrial fibrillation: Secondary | ICD-10-CM | POA: Diagnosis not present

## 2016-12-14 DIAGNOSIS — E785 Hyperlipidemia, unspecified: Secondary | ICD-10-CM | POA: Diagnosis not present

## 2016-12-14 DIAGNOSIS — I25118 Atherosclerotic heart disease of native coronary artery with other forms of angina pectoris: Secondary | ICD-10-CM | POA: Diagnosis not present

## 2016-12-15 ENCOUNTER — Telehealth: Payer: Self-pay | Admitting: *Deleted

## 2016-12-15 ENCOUNTER — Encounter: Payer: Self-pay | Admitting: *Deleted

## 2016-12-15 NOTE — Telephone Encounter (Signed)
Pt advised regarding labs, he will return in 3 months for repeat labs. Copy sent to Dr. Lyla Son, cardiologist.

## 2016-12-21 ENCOUNTER — Encounter: Payer: Self-pay | Admitting: Podiatry

## 2016-12-21 ENCOUNTER — Ambulatory Visit (INDEPENDENT_AMBULATORY_CARE_PROVIDER_SITE_OTHER): Payer: Medicare Other | Admitting: Podiatry

## 2016-12-21 VITALS — BP 155/86 | HR 68 | Resp 18

## 2016-12-21 DIAGNOSIS — I25810 Atherosclerosis of coronary artery bypass graft(s) without angina pectoris: Secondary | ICD-10-CM

## 2016-12-21 DIAGNOSIS — M79674 Pain in right toe(s): Secondary | ICD-10-CM

## 2016-12-21 DIAGNOSIS — I739 Peripheral vascular disease, unspecified: Secondary | ICD-10-CM

## 2016-12-21 DIAGNOSIS — L600 Ingrowing nail: Secondary | ICD-10-CM | POA: Diagnosis not present

## 2016-12-21 DIAGNOSIS — B351 Tinea unguium: Secondary | ICD-10-CM | POA: Diagnosis not present

## 2016-12-21 DIAGNOSIS — M79675 Pain in left toe(s): Secondary | ICD-10-CM | POA: Diagnosis not present

## 2016-12-21 NOTE — Progress Notes (Signed)
Subjective:   Patient ID: [name], male   DOB: 66 y.o.   MRN: [id number]   HPI [name] presents the office today for concerns of ingrown toenail as well as thickening to the right big toenail the area is painful with pressure in shoes.  Which is been ongoing for the last 6 months.  He denies seeing any drainage or pus coming from the area.  He denies any history of open sores.  Upon questioning him today he states he can only walk 3 blocks without getting pain to his calves with the right side worse than left.  He does not recall ever having his circulation checked.   Review of Systems  All other systems reviewed and are negative.  Past Medical History:  Diagnosis Date  . Allergy   . Anemia   . Anxiety   . Arthritis   . Cataract   . Depression   . Hypertension   . Myocardial infarction [facility])     Past Surgical History:  Procedure Laterality Date  . CORONARY ARTERY BYPASS GRAFT    . EYE SURGERY    . FRACTURE SURGERY    . LEFT HEART CATHETERIZATION WITH CORONARY/GRAFT ANGIOGRAM N/A 07/09/2013   Procedure: LEFT HEART CATHETERIZATION WITH Beatrix Fetters;  Surgeon: Clent Demark, MD;  Location: [facility] CATH LAB;  Service: Cardiovascular;  Laterality: N/A;     Current Outpatient Medications:  .  allopurinol (ZYLOPRIM) 300 MG tablet, Take 300 mg by mouth daily., Disp: , Rfl:  .  aspirin EC 81 MG tablet, Take 81 mg by mouth daily., Disp: , Rfl:  .  atorvastatin (LIPITOR) 40 MG tablet, Take 1 tablet (40 mg total) by mouth daily., Disp: 90 tablet, Rfl: 3 .  clopidogrel (PLAVIX) 75 MG tablet, Take 75 mg by mouth daily with breakfast., Disp: , Rfl:  .  metoprolol succinate (TOPROL-XL) 100 MG 24 hr tablet, Take 100 mg by mouth daily. Take with or immediately following a meal., Disp: , Rfl:  .  ramipril (ALTACE) 10 MG capsule, Take 10 mg by mouth daily., Disp: , Rfl:   Allergies  Allergen Reactions  . Shellfish Allergy Diarrhea and Nausea And Vomiting    Oysters only      Social History   Socioeconomic History  . Marital status: Married    Spouse name: Not on file  . Number of children: Not on file  . Years of education: Not on file  . Highest education level: Not on file  Social Needs  . Financial resource strain: Not on file  . Food insecurity - worry: Not on file  . Food insecurity - inability: Not on file  . Transportation needs - medical: Not on file  . Transportation needs - non-medical: Not on file  Occupational History  . Not on file  Tobacco Use  . Smoking status: Current Every Day Smoker    Packs/day: 0.40    Types: Cigarettes  . Smokeless tobacco: Never Used  Substance and Sexual Activity  . Alcohol use: Yes  . Drug use: No  . Sexual activity: Yes  Other Topics Concern  . Not on file  Social History Narrative  . Not on file          Objective:  Physical Exam  General: AAO x3, NAD; presents today with his wife  Dermatological: The nail itself appears to be hypertrophic, dystrophic with yellow-brown discoloration.  There is incurvation on the medial aspect of the right hallux toenail with tenderness to palpation.  There is no edema, erythema, drainage or pus or any clinical signs of infection present. There is no open sores or pre-ulcerative lesions identified otherwise.    Vascular: DP pulse 1/4 bilaterally, PT pulse 2/4.  CRT less than 3 seconds.  No varicose veins are present.  Neruologic: Grossly intact via light touch bilateral. Protective threshold with Semmes Wienstein monofilament intact to all pedal sites bilateral.   Musculoskeletal: No gross boney pedal deformities bilateral. No pain, crepitus, or limitation noted with foot and ankle range of motion bilateral. Muscular strength 5/5 in all groups tested bilateral.    Assessment:   66 year old male with right symptomatic ingrown toenail, onychomycosis; PAD     Plan:  -Treatment options discussed including all alternatives, risks, and complications -Etiology  of symptoms were discussed -I did an ABI today in the office on the pilot ABI study which did reveal PAD on the right side.  Given his claudication symptoms I would like to hold off on doing a partial nail avulsion of the nail although I think this will be more beneficial for him long-term.  I discussed this with him and he agrees.  I did sharply debride the right hallux toenail feel that any complications to remove the symptomatic portion of the ingrown toenail without any complications or bleeding.  I want him to monitor closely for any signs or symptoms of infection or any skin breakdown.  A consult as well as arterial duplex was ordered today  Trula Slade DPM

## 2016-12-22 ENCOUNTER — Other Ambulatory Visit: Payer: Self-pay

## 2016-12-22 DIAGNOSIS — M79675 Pain in left toe(s): Secondary | ICD-10-CM

## 2016-12-22 DIAGNOSIS — I739 Peripheral vascular disease, unspecified: Secondary | ICD-10-CM

## 2016-12-22 DIAGNOSIS — M79674 Pain in right toe(s): Secondary | ICD-10-CM

## 2016-12-28 ENCOUNTER — Other Ambulatory Visit: Payer: Self-pay

## 2016-12-28 DIAGNOSIS — I7092 Chronic total occlusion of artery of the extremities: Secondary | ICD-10-CM

## 2017-01-04 DIAGNOSIS — Z951 Presence of aortocoronary bypass graft: Secondary | ICD-10-CM | POA: Diagnosis not present

## 2017-01-04 DIAGNOSIS — I480 Paroxysmal atrial fibrillation: Secondary | ICD-10-CM | POA: Diagnosis not present

## 2017-01-04 DIAGNOSIS — I25118 Atherosclerotic heart disease of native coronary artery with other forms of angina pectoris: Secondary | ICD-10-CM | POA: Diagnosis not present

## 2017-01-04 DIAGNOSIS — E785 Hyperlipidemia, unspecified: Secondary | ICD-10-CM | POA: Diagnosis not present

## 2017-01-04 DIAGNOSIS — I10 Essential (primary) hypertension: Secondary | ICD-10-CM | POA: Diagnosis not present

## 2017-01-05 ENCOUNTER — Ambulatory Visit: Payer: Self-pay | Admitting: Podiatry

## 2017-01-16 ENCOUNTER — Ambulatory Visit: Payer: Self-pay | Admitting: Family Medicine

## 2017-02-03 ENCOUNTER — Encounter (HOSPITAL_COMMUNITY): Payer: Self-pay

## 2017-02-03 ENCOUNTER — Encounter: Payer: Self-pay | Admitting: Vascular Surgery

## 2017-11-03 DIAGNOSIS — Z23 Encounter for immunization: Secondary | ICD-10-CM | POA: Diagnosis not present

## 2017-11-15 DIAGNOSIS — E785 Hyperlipidemia, unspecified: Secondary | ICD-10-CM | POA: Diagnosis not present

## 2017-11-15 DIAGNOSIS — I10 Essential (primary) hypertension: Secondary | ICD-10-CM | POA: Diagnosis not present

## 2017-11-15 DIAGNOSIS — I2510 Atherosclerotic heart disease of native coronary artery without angina pectoris: Secondary | ICD-10-CM | POA: Diagnosis not present

## 2017-11-15 DIAGNOSIS — I480 Paroxysmal atrial fibrillation: Secondary | ICD-10-CM | POA: Diagnosis not present

## 2018-02-09 DIAGNOSIS — I739 Peripheral vascular disease, unspecified: Secondary | ICD-10-CM | POA: Diagnosis not present

## 2018-02-09 DIAGNOSIS — E785 Hyperlipidemia, unspecified: Secondary | ICD-10-CM | POA: Diagnosis not present

## 2018-02-09 DIAGNOSIS — Z951 Presence of aortocoronary bypass graft: Secondary | ICD-10-CM | POA: Diagnosis not present

## 2018-02-09 DIAGNOSIS — F17290 Nicotine dependence, other tobacco product, uncomplicated: Secondary | ICD-10-CM | POA: Diagnosis not present

## 2018-02-09 DIAGNOSIS — I25118 Atherosclerotic heart disease of native coronary artery with other forms of angina pectoris: Secondary | ICD-10-CM | POA: Diagnosis not present

## 2018-02-09 DIAGNOSIS — I10 Essential (primary) hypertension: Secondary | ICD-10-CM | POA: Diagnosis not present

## 2018-02-09 DIAGNOSIS — I480 Paroxysmal atrial fibrillation: Secondary | ICD-10-CM | POA: Diagnosis not present

## 2018-05-23 DIAGNOSIS — I739 Peripheral vascular disease, unspecified: Secondary | ICD-10-CM | POA: Diagnosis not present

## 2018-05-23 DIAGNOSIS — E785 Hyperlipidemia, unspecified: Secondary | ICD-10-CM | POA: Diagnosis not present

## 2018-05-23 DIAGNOSIS — I480 Paroxysmal atrial fibrillation: Secondary | ICD-10-CM | POA: Diagnosis not present

## 2018-05-23 DIAGNOSIS — I10 Essential (primary) hypertension: Secondary | ICD-10-CM | POA: Diagnosis not present

## 2018-05-23 DIAGNOSIS — F17290 Nicotine dependence, other tobacco product, uncomplicated: Secondary | ICD-10-CM | POA: Diagnosis not present

## 2018-05-23 DIAGNOSIS — I25118 Atherosclerotic heart disease of native coronary artery with other forms of angina pectoris: Secondary | ICD-10-CM | POA: Diagnosis not present

## 2018-08-22 DIAGNOSIS — E785 Hyperlipidemia, unspecified: Secondary | ICD-10-CM | POA: Diagnosis not present

## 2018-08-22 DIAGNOSIS — I25118 Atherosclerotic heart disease of native coronary artery with other forms of angina pectoris: Secondary | ICD-10-CM | POA: Diagnosis not present

## 2018-08-22 DIAGNOSIS — I739 Peripheral vascular disease, unspecified: Secondary | ICD-10-CM | POA: Diagnosis not present

## 2018-08-22 DIAGNOSIS — Z951 Presence of aortocoronary bypass graft: Secondary | ICD-10-CM | POA: Diagnosis not present

## 2018-08-22 DIAGNOSIS — F17290 Nicotine dependence, other tobacco product, uncomplicated: Secondary | ICD-10-CM | POA: Diagnosis not present

## 2018-08-22 DIAGNOSIS — I10 Essential (primary) hypertension: Secondary | ICD-10-CM | POA: Diagnosis not present

## 2018-08-22 DIAGNOSIS — M109 Gout, unspecified: Secondary | ICD-10-CM | POA: Diagnosis not present

## 2018-08-22 DIAGNOSIS — I480 Paroxysmal atrial fibrillation: Secondary | ICD-10-CM | POA: Diagnosis not present

## 2018-09-18 DIAGNOSIS — I480 Paroxysmal atrial fibrillation: Secondary | ICD-10-CM | POA: Diagnosis not present

## 2018-09-18 DIAGNOSIS — M109 Gout, unspecified: Secondary | ICD-10-CM | POA: Diagnosis not present

## 2018-09-18 DIAGNOSIS — F341 Dysthymic disorder: Secondary | ICD-10-CM | POA: Diagnosis not present

## 2018-09-18 DIAGNOSIS — I739 Peripheral vascular disease, unspecified: Secondary | ICD-10-CM | POA: Diagnosis not present

## 2018-09-18 DIAGNOSIS — E785 Hyperlipidemia, unspecified: Secondary | ICD-10-CM | POA: Diagnosis not present

## 2018-09-18 DIAGNOSIS — I10 Essential (primary) hypertension: Secondary | ICD-10-CM | POA: Diagnosis not present

## 2018-09-18 DIAGNOSIS — F17290 Nicotine dependence, other tobacco product, uncomplicated: Secondary | ICD-10-CM | POA: Diagnosis not present

## 2018-09-18 DIAGNOSIS — I25118 Atherosclerotic heart disease of native coronary artery with other forms of angina pectoris: Secondary | ICD-10-CM | POA: Diagnosis not present

## 2018-09-21 DIAGNOSIS — I2510 Atherosclerotic heart disease of native coronary artery without angina pectoris: Secondary | ICD-10-CM | POA: Diagnosis not present

## 2018-09-21 DIAGNOSIS — I10 Essential (primary) hypertension: Secondary | ICD-10-CM | POA: Diagnosis not present

## 2018-09-21 DIAGNOSIS — E785 Hyperlipidemia, unspecified: Secondary | ICD-10-CM | POA: Diagnosis not present

## 2018-09-21 DIAGNOSIS — I480 Paroxysmal atrial fibrillation: Secondary | ICD-10-CM | POA: Diagnosis not present

## 2018-11-02 DIAGNOSIS — I480 Paroxysmal atrial fibrillation: Secondary | ICD-10-CM | POA: Diagnosis not present

## 2018-11-26 DIAGNOSIS — F17290 Nicotine dependence, other tobacco product, uncomplicated: Secondary | ICD-10-CM | POA: Diagnosis not present

## 2018-11-26 DIAGNOSIS — I10 Essential (primary) hypertension: Secondary | ICD-10-CM | POA: Diagnosis not present

## 2018-11-26 DIAGNOSIS — I25118 Atherosclerotic heart disease of native coronary artery with other forms of angina pectoris: Secondary | ICD-10-CM | POA: Diagnosis not present

## 2018-11-26 DIAGNOSIS — I480 Paroxysmal atrial fibrillation: Secondary | ICD-10-CM | POA: Diagnosis not present

## 2018-11-26 DIAGNOSIS — I739 Peripheral vascular disease, unspecified: Secondary | ICD-10-CM | POA: Diagnosis not present

## 2018-11-26 DIAGNOSIS — M109 Gout, unspecified: Secondary | ICD-10-CM | POA: Diagnosis not present

## 2018-11-26 DIAGNOSIS — F341 Dysthymic disorder: Secondary | ICD-10-CM | POA: Diagnosis not present

## 2018-11-26 DIAGNOSIS — E785 Hyperlipidemia, unspecified: Secondary | ICD-10-CM | POA: Diagnosis not present

## 2018-11-26 DIAGNOSIS — N529 Male erectile dysfunction, unspecified: Secondary | ICD-10-CM | POA: Diagnosis not present

## 2018-11-28 DIAGNOSIS — I480 Paroxysmal atrial fibrillation: Secondary | ICD-10-CM | POA: Diagnosis not present

## 2018-12-02 DIAGNOSIS — Z23 Encounter for immunization: Secondary | ICD-10-CM | POA: Diagnosis not present

## 2018-12-05 DIAGNOSIS — I480 Paroxysmal atrial fibrillation: Secondary | ICD-10-CM | POA: Diagnosis not present

## 2018-12-28 DIAGNOSIS — I480 Paroxysmal atrial fibrillation: Secondary | ICD-10-CM | POA: Diagnosis not present

## 2019-02-01 DIAGNOSIS — I480 Paroxysmal atrial fibrillation: Secondary | ICD-10-CM | POA: Diagnosis not present

## 2019-03-06 DIAGNOSIS — I480 Paroxysmal atrial fibrillation: Secondary | ICD-10-CM | POA: Diagnosis not present

## 2019-03-08 ENCOUNTER — Other Ambulatory Visit: Payer: Self-pay

## 2019-03-08 ENCOUNTER — Encounter (HOSPITAL_COMMUNITY): Payer: Self-pay | Admitting: Emergency Medicine

## 2019-03-08 ENCOUNTER — Ambulatory Visit (HOSPITAL_COMMUNITY)
Admission: EM | Admit: 2019-03-08 | Discharge: 2019-03-08 | Disposition: A | Discharge status: Home / Self Care | Payer: Medicare Other

## 2019-03-08 ENCOUNTER — Ambulatory Visit (INDEPENDENT_AMBULATORY_CARE_PROVIDER_SITE_OTHER): Payer: Medicare Other

## 2019-03-08 DIAGNOSIS — I739 Peripheral vascular disease, unspecified: Secondary | ICD-10-CM | POA: Diagnosis not present

## 2019-03-08 DIAGNOSIS — I519 Heart disease, unspecified: Secondary | ICD-10-CM

## 2019-03-08 DIAGNOSIS — M25562 Pain in left knee: Secondary | ICD-10-CM | POA: Diagnosis not present

## 2019-03-08 DIAGNOSIS — F17200 Nicotine dependence, unspecified, uncomplicated: Secondary | ICD-10-CM

## 2019-03-08 NOTE — ED Provider Notes (Signed)
[place]   MRN: [id number] DOB: 07-04-1950  Subjective:   [name] is a 69 y.o. male presenting for several month history of persistent bilateral calf tenderness elicited when he walks 1-2 blocks.  He has been doing this to try and exercise.  Has a history of heart disease, status post MI.  He is on Coumadin, managed with his cardiologist.  Patient also has a history of gout but denies redness, swelling, warmth.  He is also having 3-day history of left-sided knee pain, feels like it is buckling.  Denies falls, trauma.  Denies history of arthritis.  Has longstanding history of smoking, greater than 25 pack years and is still smoking.  No current facility-administered medications for this encounter.  Current Outpatient Medications:  .  allopurinol (ZYLOPRIM) 300 MG tablet, Take 300 mg by mouth daily., Disp: , Rfl:  .  aspirin EC 81 MG tablet, Take 81 mg by mouth daily., Disp: , Rfl:  .  atorvastatin (LIPITOR) 40 MG tablet, Take 1 tablet (40 mg total) by mouth daily., Disp: 90 tablet, Rfl: 3 .  clopidogrel (PLAVIX) 75 MG tablet, Take 75 mg by mouth daily with breakfast., Disp: , Rfl:  .  metoprolol succinate (TOPROL-XL) 100 MG 24 hr tablet, Take 100 mg by mouth daily. Take with or immediately following a meal., Disp: , Rfl:  .  ramipril (ALTACE) 10 MG capsule, Take 10 mg by mouth daily., Disp: , Rfl:  .  warfarin (COUMADIN) 5 MG tablet, , Disp: , Rfl:     Allergies  Allergen Reactions  . Shellfish Allergy Diarrhea and Nausea And Vomiting    Oysters only     Past Medical History:  Diagnosis Date  . Allergy   . Anemia   . Anxiety   . Arthritis   . Cataract   . Depression   . Hypertension   . Myocardial infarction [facility])      Past Surgical History:  Procedure Laterality Date  . CORONARY ARTERY BYPASS GRAFT    . EYE SURGERY    . FRACTURE SURGERY    . LEFT HEART CATHETERIZATION WITH CORONARY/GRAFT ANGIOGRAM N/A 07/09/2013   Procedure: LEFT HEART CATHETERIZATION  WITH Beatrix Fetters;  Surgeon: Clent Demark, MD;  Location: [facility] CATH LAB;  Service: Cardiovascular;  Laterality: N/A;    Family History  Problem Relation Age of Onset  . Diabetes Mother   . Cancer Father   . Hypertension Father   . Stroke Father     Social History   Tobacco Use  . Smoking status: Current Every Day Smoker    Packs/day: 0.40    Types: Cigarettes  . Smokeless tobacco: Never Used  Substance Use Topics  . Alcohol use: Yes  . Drug use: No    ROS   Objective:   Vitals: BP 135/69   Pulse 75   Temp 98.3 F (36.8 C)   Resp 16   SpO2 100%   Physical Exam Constitutional:      General: He is not in acute distress.    Appearance: Normal appearance. He is well-developed and normal weight. He is not ill-appearing, toxic-appearing or diaphoretic.  HENT:     Head: Normocephalic and atraumatic.     Right Ear: External ear normal.     Left Ear: External ear normal.     Nose: Nose normal.     Mouth/Throat:     Pharynx: Oropharynx is clear.  Eyes:     General: No scleral icterus.  Right eye: No discharge.        Left eye: No discharge.     Extraocular Movements: Extraocular movements intact.     Pupils: Pupils are equal, round, and reactive to light.  Cardiovascular:     Rate and Rhythm: Normal rate.  Pulmonary:     Effort: Pulmonary effort is normal.  Musculoskeletal:     Cervical back: Normal range of motion.     Left knee: No swelling, deformity, effusion, erythema, ecchymosis, lacerations, bony tenderness or crepitus. Decreased range of motion. Tenderness present over the medial joint line and lateral joint line. Normal alignment and normal patellar mobility.     Right lower leg: Tenderness present. No swelling, deformity, lacerations or bony tenderness. No edema.     Left lower leg: Tenderness present. No swelling, deformity, lacerations or bony tenderness. No edema.       Legs:  Skin:    General: Skin is warm and dry.  Neurological:       Mental Status: He is alert and oriented to person, place, and time.     Motor: No weakness.     Coordination: Coordination normal.     Gait: Gait normal.     Deep Tendon Reflexes: Reflexes normal.  Psychiatric:        Mood and Affect: Mood normal.        Behavior: Behavior normal.        Thought Content: Thought content normal.        Judgment: Judgment normal.    DG Knee Complete 4 Views Left  Result Date: 03/08/2019 CLINICAL DATA:  Left knee pain for 3 days. No injury. History of gout. EXAM: LEFT KNEE - COMPLETE 4+ VIEW COMPARISON:  None. FINDINGS: No fracture or bone lesion. Knee joint normally spaced and aligned. No arthropathic changes. No joint effusion. Fairly extensive arterial vascular calcifications extend along the femoral, popliteal and proximal calf vessels. IMPRESSION: No fracture, bone lesion or knee joint abnormality. No arthropathic changes. Electronically Signed   By: Lajean Manes M.D.   On: 03/08/2019 14:22   Assessment and Plan :   1. Bilateral claudication of lower limb ([place])   2. Acute pain of left knee   3. Heart disease   4. Tobacco use disorder     Emphasized need to quit smoking for improvement with his health including what he was seen for today, claudication. Follow up with cardiologist for further testing, management including consideration for starting cilostazl.  Use APAP for knee pain, f/u with PCP to pursue referral to ortho for MRI/US to look for degenerative changes/injuries of soft tissue such as meniscus. Counseled patient on potential for adverse effects with medications prescribed/recommended today, ER and return-to-clinic precautions discussed, patient verbalized understanding.   Jaynee Eagles, PA-C 03/08/19 1438

## 2019-03-08 NOTE — ED Triage Notes (Signed)
Pt c/o L leg pain, states it tender around the knee with soreness in the back of his calf. Pt states he feels tightness in his L leg and ankle. Symptoms x3 days.

## 2019-03-08 NOTE — Discharge Instructions (Addendum)
Please make sure that you check back with your regular doctor regarding your claudication.  It would be tremendously helpful for you to quit smoking.  There are other medications that are not appropriate for me to start through the urgent care setting.  But you can discuss starting cilostazol for your claudication with your regular doctor. You may take 500mg -650mg  Tylenol every 6 hours for pain and inflammation.

## 2019-03-11 DIAGNOSIS — I480 Paroxysmal atrial fibrillation: Secondary | ICD-10-CM | POA: Diagnosis not present

## 2019-03-11 DIAGNOSIS — M109 Gout, unspecified: Secondary | ICD-10-CM | POA: Diagnosis not present

## 2019-03-11 DIAGNOSIS — I10 Essential (primary) hypertension: Secondary | ICD-10-CM | POA: Diagnosis not present

## 2019-03-11 DIAGNOSIS — F17290 Nicotine dependence, other tobacco product, uncomplicated: Secondary | ICD-10-CM | POA: Diagnosis not present

## 2019-03-11 DIAGNOSIS — E785 Hyperlipidemia, unspecified: Secondary | ICD-10-CM | POA: Diagnosis not present

## 2019-03-11 DIAGNOSIS — I739 Peripheral vascular disease, unspecified: Secondary | ICD-10-CM | POA: Diagnosis not present

## 2019-03-11 DIAGNOSIS — I25118 Atherosclerotic heart disease of native coronary artery with other forms of angina pectoris: Secondary | ICD-10-CM | POA: Diagnosis not present

## 2019-03-18 DIAGNOSIS — I480 Paroxysmal atrial fibrillation: Secondary | ICD-10-CM | POA: Diagnosis not present

## 2019-05-30 DIAGNOSIS — I739 Peripheral vascular disease, unspecified: Secondary | ICD-10-CM | POA: Diagnosis not present

## 2019-05-30 DIAGNOSIS — F17290 Nicotine dependence, other tobacco product, uncomplicated: Secondary | ICD-10-CM | POA: Diagnosis not present

## 2019-05-30 DIAGNOSIS — I480 Paroxysmal atrial fibrillation: Secondary | ICD-10-CM | POA: Diagnosis not present

## 2019-05-30 DIAGNOSIS — E785 Hyperlipidemia, unspecified: Secondary | ICD-10-CM | POA: Diagnosis not present

## 2019-05-30 DIAGNOSIS — M109 Gout, unspecified: Secondary | ICD-10-CM | POA: Diagnosis not present

## 2019-05-30 DIAGNOSIS — I10 Essential (primary) hypertension: Secondary | ICD-10-CM | POA: Diagnosis not present

## 2019-05-30 DIAGNOSIS — I25118 Atherosclerotic heart disease of native coronary artery with other forms of angina pectoris: Secondary | ICD-10-CM | POA: Diagnosis not present

## 2019-07-11 ENCOUNTER — Ambulatory Visit (INDEPENDENT_AMBULATORY_CARE_PROVIDER_SITE_OTHER): Payer: Medicare Other | Admitting: Family Medicine

## 2019-07-11 ENCOUNTER — Encounter: Payer: Self-pay | Admitting: Family Medicine

## 2019-07-11 ENCOUNTER — Other Ambulatory Visit: Payer: Self-pay

## 2019-07-11 VITALS — BP 130/66 | HR 64 | Temp 97.6°F | Resp 14 | Ht 72.0 in | Wt 173.8 lb

## 2019-07-11 DIAGNOSIS — Z720 Tobacco use: Secondary | ICD-10-CM | POA: Diagnosis not present

## 2019-07-11 DIAGNOSIS — M79605 Pain in left leg: Secondary | ICD-10-CM | POA: Diagnosis not present

## 2019-07-11 DIAGNOSIS — M79604 Pain in right leg: Secondary | ICD-10-CM

## 2019-07-11 DIAGNOSIS — T466X5A Adverse effect of antihyperlipidemic and antiarteriosclerotic drugs, initial encounter: Secondary | ICD-10-CM

## 2019-07-11 DIAGNOSIS — I739 Peripheral vascular disease, unspecified: Secondary | ICD-10-CM | POA: Diagnosis not present

## 2019-07-11 DIAGNOSIS — Z8639 Personal history of other endocrine, nutritional and metabolic disease: Secondary | ICD-10-CM

## 2019-07-11 NOTE — Progress Notes (Signed)
Patient ID: [name], male    DOB: [birth year]  Age: 69 y.o. MRN: [id number]  Chief Complaint  Patient presents with  . Leg Pain    pt is having pain in both legs pt does not note any numbness or tingling, pt states it is the worst at night, pt has pain in lower back on the Lt side and pain in the Lt hip. Pt states he has had this pain for several years but it has gotten worse over the past year.     Subjective:   69 year old man who comes in with history of having a lot of aching in his legs bilaterally over the last 3 years but it has been worse the past year.  It especially is when he is walking or in the evenings he just has aching.  It bothers him when he goes to bed.  He has a history of coronary artery disease and has been on statins for many years.  He does not take them every day but he is pretty good about taking his medicines.  His atorvastatin is prescribed by his cardiologist.  He does smoke half pack a day which he has done for many years.  He does have a history of gout and is on allopurinol.  It has been a long time since he had a uric acid checked but it was excellent.   Current allergies, medications, problem list, past/family and social histories reviewed.  Objective:  BP 130/66   Pulse 64   Temp 97.6 F (36.4 C) (Temporal)   Resp 14   Ht 6' (1.829 m)   Wt 173 lb 12.8 oz (78.8 kg)   SpO2 100%   BMI 23.57 kg/m  Pleasant alert gentleman in no major distress.  Abdomen is soft without masses or tenderness.  He has bilateral bruits in the iliac area.  Pedal pulses are nonpalpable on the left, possibly minimal pulse could be felt on the right but even that was in question.  Calves are nontender.    Assessment & Plan:   Assessment: 1. Leg pain, bilateral   2. Peripheral vascular disease ([place])   3. Tobacco abuse   4. Adverse effect of statin   5. History of hyperlipidemia       Plan: See instructions.  Orders Placed This Encounter  Procedures  .  Ambulatory referral to Vascular Surgery    Referral Priority:   Routine    Referral Type:   Surgical    Referral Reason:   Specialty Services Required    Requested Specialty:   Vascular Surgery    Number of Visits Requested:   1    No orders of the defined types were placed in this encounter.        Patient Instructions    Hold the atorvastatin for 2 weeks.  If your leg pains go away, you should discuss with your cardiologist what to do about the statin.  Referral is being made to a vascular surgeon to assess the decreased circulation to your legs and the bruits (blowing sound) that can be heard over the arteries at both sides of your lower abdomen.  No new or medicines are being given you today while we are seeing what the above show.  If necessary return here, but hopefully the specialist can do something for you.  Stop smoking.  This is absolutely necessary for you.     If you have lab work done today you will be contacted with your  lab results within the next 2 weeks.  If you have not heard from [place] then please contact us. The fastest way to get your results is to register for My Chart.   IF you received an x-ray today, you will receive an invoice from [facility]. Please contact [facility] at [phone] with questions or concerns regarding your invoice.   IF you received labwork today, you will receive an invoice from [facility]. Please contact [facility] at [phone] with questions or concerns regarding your invoice.   Our billing staff will not be able to assist you with questions regarding bills from these companies.  You will be contacted with the lab results as soon as they are available. The fastest way to get your results is to activate your My Chart account. Instructions are located on the last page of this paperwork. If you have not heard from [place] regarding the results in 2 weeks, please contact this office.        Return if symptoms  worsen or fail to improve.   Ruben Reason, MD 07/11/2019

## 2019-07-11 NOTE — Patient Instructions (Addendum)
  Hold the atorvastatin for 2 weeks.  If your leg pains go away, you should discuss with your cardiologist what to do about the statin.  Referral is being made to a vascular surgeon to assess the decreased circulation to your legs and the bruits (blowing sound) that can be heard over the arteries at both sides of your lower abdomen.  No new or medicines are being given you today while we are seeing what the above show.  If necessary return here, but hopefully the specialist can do something for you.  Stop smoking.  This is absolutely necessary for you.     If you have lab work done today you will be contacted with your lab results within the next 2 weeks.  If you have not heard from [place] then please contact us. The fastest way to get your results is to register for My Chart.   IF you received an x-ray today, you will receive an invoice from [facility]. Please contact [facility] at [phone] with questions or concerns regarding your invoice.   IF you received labwork today, you will receive an invoice from [employer]. Please contact [facility] at [phone] with questions or concerns regarding your invoice.   Our billing staff will not be able to assist you with questions regarding bills from these companies.  You will be contacted with the lab results as soon as they are available. The fastest way to get your results is to activate your My Chart account. Instructions are located on the last page of this paperwork. If you have not heard from [place] regarding the results in 2 weeks, please contact this office.

## 2019-08-29 DIAGNOSIS — F17290 Nicotine dependence, other tobacco product, uncomplicated: Secondary | ICD-10-CM | POA: Diagnosis not present

## 2019-08-29 DIAGNOSIS — I480 Paroxysmal atrial fibrillation: Secondary | ICD-10-CM | POA: Diagnosis not present

## 2019-08-29 DIAGNOSIS — I739 Peripheral vascular disease, unspecified: Secondary | ICD-10-CM | POA: Diagnosis not present

## 2019-08-29 DIAGNOSIS — I25118 Atherosclerotic heart disease of native coronary artery with other forms of angina pectoris: Secondary | ICD-10-CM | POA: Diagnosis not present

## 2019-09-17 ENCOUNTER — Other Ambulatory Visit: Payer: Self-pay

## 2019-09-17 DIAGNOSIS — I25709 Atherosclerosis of coronary artery bypass graft(s), unspecified, with unspecified angina pectoris: Secondary | ICD-10-CM

## 2019-09-30 ENCOUNTER — Ambulatory Visit (INDEPENDENT_AMBULATORY_CARE_PROVIDER_SITE_OTHER): Payer: Medicare Other | Admitting: Surgery

## 2019-09-30 ENCOUNTER — Other Ambulatory Visit: Payer: Self-pay

## 2019-09-30 ENCOUNTER — Ambulatory Visit (HOSPITAL_COMMUNITY)
Admission: RE | Admit: 2019-09-30 | Discharge: 2019-09-30 | Disposition: A | Discharge status: Home / Self Care | Payer: Medicare Other | Source: Ambulatory Visit | Attending: Surgery | Admitting: Surgery

## 2019-09-30 ENCOUNTER — Encounter: Payer: Self-pay | Admitting: Surgery

## 2019-09-30 VITALS — BP 151/99 | HR 100 | Temp 98.0°F | Resp 20 | Ht 72.0 in | Wt 177.0 lb

## 2019-09-30 DIAGNOSIS — I70213 Atherosclerosis of native arteries of extremities with intermittent claudication, bilateral legs: Secondary | ICD-10-CM | POA: Diagnosis not present

## 2019-09-30 DIAGNOSIS — I25709 Atherosclerosis of coronary artery bypass graft(s), unspecified, with unspecified angina pectoris: Secondary | ICD-10-CM | POA: Insufficient documentation

## 2019-09-30 MED ORDER — CILOSTAZOL 100 MG PO TABS: 100.0000 mg | ORAL_TABLET | Freq: Two times a day (BID) | ORAL | 11 refills | Status: DC

## 2019-09-30 NOTE — Progress Notes (Signed)
Vascular and Vein Specialist of [place]  Patient name: [name] MRN: [id number] DOB: December 30, 1950 Sex: male   REQUESTING PROVIDER:    Dr. Linna Darner    REASON FOR CONSULT:    Leg pain  HISTORY OF PRESENT ILLNESS:   [name] is a 69 y.o. male, who is referred for evaluation of bilateral claudication.  The patient states this has been going on for several years but has gotten progressively worse.  He can now only walk about a half a block before he gets severe cramps in both calves.  The right leg bothers him more than the left.  Patient has a history of cardiovascular disease.  He is status post CABG at age 64.  He has subsequently undergone PCI x3.  He is on Eliquis for atrial fibrillation.  He takes a statin for hypercholesterolemia.  He is a current smoker.  PAST MEDICAL HISTORY    Past Medical History:  Diagnosis Date  . Allergy   . Anemia   . Anxiety   . Arthritis   . Cataract   . Depression   . Hypertension   . Myocardial infarction [facility])      FAMILY HISTORY   Family History  Problem Relation Age of Onset  . Diabetes Mother   . Cancer Father   . Hypertension Father   . Stroke Father     SOCIAL HISTORY:   Social History   Socioeconomic History  . Marital status: Married    Spouse name: Not on file  . Number of children: Not on file  . Years of education: Not on file  . Highest education level: Not on file  Occupational History  . Not on file  Tobacco Use  . Smoking status: Current Every Day Smoker    Packs/day: 1.00    Types: Cigarettes  . Smokeless tobacco: Never Used  Vaping Use  . Vaping Use: Never used  Substance and Sexual Activity  . Alcohol use: Yes  . Drug use: No  . Sexual activity: Yes  Other Topics Concern  . Not on file  Social History Narrative  . Not on file   Social Determinants of Health   Financial Resource Strain:   . Difficulty of Paying Living Expenses: Not on file  Food  Insecurity:   . Worried About Charity fundraiser in the Last Year: Not on file  . Ran Out of Food in the Last Year: Not on file  Transportation Needs:   . Lack of Transportation (Medical): Not on file  . Lack of Transportation (Non-Medical): Not on file  Physical Activity:   . Days of Exercise per Week: Not on file  . Minutes of Exercise per Session: Not on file  Stress:   . Feeling of Stress : Not on file  Social Connections:   . Frequency of Communication with Friends and Family: Not on file  . Frequency of Social Gatherings with Friends and Family: Not on file  . Attends Religious Services: Not on file  . Active Member of Clubs or Organizations: Not on file  . Attends Archivist Meetings: Not on file  . Marital Status: Not on file  Intimate Partner Violence:   . Fear of Current or Ex-Partner: Not on file  . Emotionally Abused: Not on file  . Physically Abused: Not on file  . Sexually Abused: Not on file    ALLERGIES:    Allergies  Allergen Reactions  . Shellfish Allergy Diarrhea and Nausea  And Vomiting    Oysters only     CURRENT MEDICATIONS:    Current Outpatient Medications  Medication Sig Dispense Refill  . allopurinol (ZYLOPRIM) 300 MG tablet Take 300 mg by mouth daily.    Marland Kitchen aspirin EC 81 MG tablet Take 81 mg by mouth daily.    Marland Kitchen atorvastatin (LIPITOR) 40 MG tablet Take 1 tablet (40 mg total) by mouth daily. 90 tablet 3  . ELIQUIS 5 MG TABS tablet     . metoprolol succinate (TOPROL-XL) 100 MG 24 hr tablet Take 100 mg by mouth daily. Take with or immediately following a meal.    . ramipril (ALTACE) 10 MG capsule Take 10 mg by mouth daily.    . sertraline (ZOLOFT) 25 MG tablet     . clopidogrel (PLAVIX) 75 MG tablet Take 75 mg by mouth daily with breakfast. (Patient not taking: Reported on 09/30/2019)     No current facility-administered medications for this visit.    REVIEW OF SYSTEMS:   [X]  denotes positive finding, [ ]  denotes negative  finding Cardiac  Comments:  Chest pain or chest pressure:    Shortness of breath upon exertion:    Short of breath when lying flat:    Irregular heart rhythm:        Vascular    Pain in calf, thigh, or hip brought on by ambulation:    Pain in feet at night that wakes you up from your sleep:     Blood clot in your veins:    Leg swelling:         Pulmonary    Oxygen at home:    Productive cough:     Wheezing:         Neurologic    Sudden weakness in arms or legs:     Sudden numbness in arms or legs:     Sudden onset of difficulty speaking or slurred speech:    Temporary loss of vision in one eye:     Problems with dizziness:         Gastrointestinal    Blood in stool:      Vomited blood:         Genitourinary    Burning when urinating:     Blood in urine:        Psychiatric    Major depression:         Hematologic    Bleeding problems:    Problems with blood clotting too easily:        Skin    Rashes or ulcers:        Constitutional    Fever or chills:     PHYSICAL EXAM:   Vitals:   09/30/19 1127  BP: (!) 151/99  Pulse: 100  Resp: 20  Temp: 98 F (36.7 C)  SpO2: 98%  Weight: 177 lb (80.3 kg)  Height: 6' (1.829 m)    GENERAL: The patient is a well-nourished male, in no acute distress. The vital signs are documented above. CARDIAC: There is a regular rate and rhythm.  VASCULAR: Palpable femoral pulses.  Nonpalpable pedal pulses. PULMONARY: Nonlabored respirations ABDOMEN: Soft and non-tender  MUSCULOSKELETAL: There are no major deformities or cyanosis. NEUROLOGIC: No focal weakness or paresthesias are detected. SKIN: There are no ulcers or rashes noted. PSYCHIATRIC: The patient has a normal affect.  STUDIES:   I have reviewed the following studies: +-------+-----------+-----------+------------+------------+  ABI/TBIToday's ABIToday's TBIPrevious ABIPrevious TBI  +-------+-----------+-----------+------------+------------+  Right 0.53  0.00                  +-------+-----------+-----------+------------+------------+  Left  0.54    0.00                  +-------+-----------+-----------+------------+------------+   ASSESSMENT and PLAN   Claudication: We discussed the need to optimize him from a medical standpoint.  I stressed the importance of smoking cessation.  We also discussed the importance of an exercise program.  I am also starting him on cilostazol to see if this improves his walking distance.  He is going to follow-up with me in 3 months.  If he has not had any improvement in his symptoms, I would proceed with angiography.   Leia Alf, MD, FACS [facility] of [facility] [phone] Pager [phone]

## 2019-12-05 DIAGNOSIS — I25118 Atherosclerotic heart disease of native coronary artery with other forms of angina pectoris: Secondary | ICD-10-CM | POA: Diagnosis not present

## 2019-12-05 DIAGNOSIS — I10 Essential (primary) hypertension: Secondary | ICD-10-CM | POA: Diagnosis not present

## 2019-12-05 DIAGNOSIS — I480 Paroxysmal atrial fibrillation: Secondary | ICD-10-CM | POA: Diagnosis not present

## 2019-12-05 DIAGNOSIS — E785 Hyperlipidemia, unspecified: Secondary | ICD-10-CM | POA: Diagnosis not present

## 2019-12-09 DIAGNOSIS — E785 Hyperlipidemia, unspecified: Secondary | ICD-10-CM | POA: Diagnosis not present

## 2019-12-09 DIAGNOSIS — I25118 Atherosclerotic heart disease of native coronary artery with other forms of angina pectoris: Secondary | ICD-10-CM | POA: Diagnosis not present

## 2019-12-09 DIAGNOSIS — I10 Essential (primary) hypertension: Secondary | ICD-10-CM | POA: Diagnosis not present

## 2019-12-09 DIAGNOSIS — I480 Paroxysmal atrial fibrillation: Secondary | ICD-10-CM | POA: Diagnosis not present

## 2019-12-19 DIAGNOSIS — Z23 Encounter for immunization: Secondary | ICD-10-CM | POA: Diagnosis not present

## 2019-12-23 DIAGNOSIS — I480 Paroxysmal atrial fibrillation: Secondary | ICD-10-CM | POA: Diagnosis not present

## 2019-12-30 ENCOUNTER — Ambulatory Visit: Payer: Medicare Other | Admitting: Surgery

## 2020-01-23 DIAGNOSIS — I10 Essential (primary) hypertension: Secondary | ICD-10-CM | POA: Diagnosis not present

## 2020-01-23 DIAGNOSIS — E785 Hyperlipidemia, unspecified: Secondary | ICD-10-CM | POA: Diagnosis not present

## 2020-01-23 DIAGNOSIS — M109 Gout, unspecified: Secondary | ICD-10-CM | POA: Diagnosis not present

## 2020-01-23 DIAGNOSIS — I480 Paroxysmal atrial fibrillation: Secondary | ICD-10-CM | POA: Diagnosis not present

## 2020-01-23 DIAGNOSIS — I2510 Atherosclerotic heart disease of native coronary artery without angina pectoris: Secondary | ICD-10-CM | POA: Diagnosis not present

## 2020-01-29 DIAGNOSIS — Z20822 Contact with and (suspected) exposure to covid-19: Secondary | ICD-10-CM | POA: Diagnosis not present

## 2020-02-06 DIAGNOSIS — M109 Gout, unspecified: Secondary | ICD-10-CM | POA: Diagnosis not present

## 2020-02-06 DIAGNOSIS — F1010 Alcohol abuse, uncomplicated: Secondary | ICD-10-CM | POA: Diagnosis not present

## 2020-02-06 DIAGNOSIS — I25118 Atherosclerotic heart disease of native coronary artery with other forms of angina pectoris: Secondary | ICD-10-CM | POA: Diagnosis not present

## 2020-02-06 DIAGNOSIS — I739 Peripheral vascular disease, unspecified: Secondary | ICD-10-CM | POA: Diagnosis not present

## 2020-02-10 ENCOUNTER — Other Ambulatory Visit: Payer: Self-pay

## 2020-02-10 ENCOUNTER — Ambulatory Visit (INDEPENDENT_AMBULATORY_CARE_PROVIDER_SITE_OTHER): Payer: Medicare HMO | Admitting: Surgery

## 2020-02-10 ENCOUNTER — Encounter: Payer: Self-pay | Admitting: Surgery

## 2020-02-10 VITALS — BP 146/89 | HR 116 | Temp 97.9°F | Resp 16 | Ht 72.0 in | Wt 181.0 lb

## 2020-02-10 DIAGNOSIS — I70213 Atherosclerosis of native arteries of extremities with intermittent claudication, bilateral legs: Secondary | ICD-10-CM | POA: Diagnosis not present

## 2020-02-10 NOTE — H&P (View-Only) (Signed)
 Vascular and Vein Specialist of   Patient name: [name] MRN: [id number] DOB: 03/11/1950 Sex: male   REASON FOR VISIT:    Follow up  HISOTRY OF PRESENT ILLNESS:    [name] is a 69 y.o. male who I met in September 2021 for evaluation of bilateral claudication.  The patient had been having progressively worse cramps in both calves with walking approximately half a block.  The right leg was more severe than the left.  We initially attempted to manage this nonoperatively, beginning with smoking cessation, and exercise program, cilostazol.  He is back for follow-up.  He continues to have significant difficulty with ambulation in his right leg.  He is also concerned about the appearance of his right thumbnail.  Patient has a history of cardiovascular disease.  He is status post CABG at age 38.  He has subsequently undergone PCI x3.  He is on Eliquis for atrial fibrillation.  He takes a statin for hypercholesterolemia.  He is a current smoker. PAST MEDICAL HISTORY:   Past Medical History:  Diagnosis Date  . Allergy   . Anemia   . Anxiety   . Arthritis   . Cataract   . Depression   . Hypertension   . Myocardial infarction ([facility])      FAMILY HISTORY:   Family History  Problem Relation Age of Onset  . Diabetes Mother   . Cancer Father   . Hypertension Father   . Stroke Father     SOCIAL HISTORY:   Social History   Tobacco Use  . Smoking status: Current Every Day Smoker    Packs/day: 1.00    Types: Cigarettes  . Smokeless tobacco: Never Used  Substance Use Topics  . Alcohol use: Yes     ALLERGIES:   Allergies  Allergen Reactions  . Shellfish Allergy Diarrhea and Nausea And Vomiting    Oysters only      CURRENT MEDICATIONS:   Current Outpatient Medications  Medication Sig Dispense Refill  . allopurinol (ZYLOPRIM) 300 MG tablet Take 300 mg by mouth daily.    . aspirin EC 81 MG tablet Take 81 mg by mouth  daily.    . atorvastatin (LIPITOR) 40 MG tablet Take 1 tablet (40 mg total) by mouth daily. 90 tablet 3  . cilostazol (PLETAL) 100 MG tablet Take 1 tablet (100 mg total) by mouth 2 (two) times daily before a meal. 60 tablet 11  . ELIQUIS 5 MG TABS tablet     . metoprolol succinate (TOPROL-XL) 100 MG 24 hr tablet Take 100 mg by mouth daily. Take with or immediately following a meal.    . ramipril (ALTACE) 10 MG capsule Take 10 mg by mouth daily.    . sertraline (ZOLOFT) 25 MG tablet     . clopidogrel (PLAVIX) 75 MG tablet Take 75 mg by mouth daily with breakfast. (Patient not taking: No sig reported)     No current facility-administered medications for this visit.    REVIEW OF SYSTEMS:   [X] denotes positive finding, [ ] denotes negative finding Cardiac  Comments:  Chest pain or chest pressure:    Shortness of breath upon exertion:    Short of breath when lying flat:    Irregular heart rhythm:        Vascular    Pain in calf, thigh, or hip brought on by ambulation: x   Pain in feet at night that wakes you up from your sleep:       Blood clot in your veins:    Leg swelling:         Pulmonary    Oxygen at home:    Productive cough:     Wheezing:         Neurologic    Sudden weakness in arms or legs:     Sudden numbness in arms or legs:     Sudden onset of difficulty speaking or slurred speech:    Temporary loss of vision in one eye:     Problems with dizziness:         Gastrointestinal    Blood in stool:     Vomited blood:         Genitourinary    Burning when urinating:     Blood in urine:        Psychiatric    Major depression:         Hematologic    Bleeding problems:    Problems with blood clotting too easily:        Skin    Rashes or ulcers:        Constitutional    Fever or chills:      PHYSICAL EXAM:   Vitals:   02/10/20 1028  BP: (!) 146/89  Pulse: (!) 116  Resp: 16  Temp: 97.9 F (36.6 C)  TempSrc: Temporal  SpO2: 97%  Weight: 181 lb (82.1  kg)  Height: 6' (1.829 m)    GENERAL: The patient is a well-nourished male, in no acute distress. The vital signs are documented above. CARDIAC: There is a regular rate and rhythm.  VASCULAR: Nonpalpable pedal pulses PULMONARY: Non-labored respirations MUSCULOSKELETAL: There are no major deformities or cyanosis. NEUROLOGIC: No focal weakness or paresthesias are detected. SKIN: There are no ulcers or rashes noted. PSYCHIATRIC: The patient has a normal affect.  STUDIES:   None  MEDICAL ISSUES:   PAD with claudication, right greater than left: The patient has failed conservative, nonoperative therapy.  His activities are severely limited by his symptoms, and he has early ulcerative changes to his right great toe.  We discussed proceeding with angiography which would be through a left femoral approach.  I will perform bilateral runoff and intervention on the right leg if amenable.  We discussed the details of the procedure as well as the risks and benefits.  Is been scheduled for Tuesday, February 1.  He will stop his Eliquis.    Leia Alf, MD, FACS [facility] Specialists of [facility] [phone] Pager [phone]

## 2020-02-10 NOTE — Progress Notes (Signed)
V[facility] and [facility]in Specialist of [place]  Patient name: [name] MRN: [id number] DOB: 1950/08/28 Sex: male   REASON FOR VISIT:    Follow up  HISOTRY OF PRESENT ILLNESS:    [name] is a 70 y.o. male who I met in September 2021 for evaluation of bilateral claudication.  The patient had been having progressively worse cramps in both calves with walking approximately half a block.  The right leg was more severe than the left.  We initially attempted to manage this nonoperatively, beginning with smoking cessation, and exercise program, cilostazol.  He is back for follow-up.  He continues to have significant difficulty with ambulation in his right leg.  He is also concerned about the appearance of his right thumbnail.  Patient has a history of cardiovascular disease.  He is status post CABG at age 55.  He has subsequently undergone PCI x3.  He is on Eliquis for atrial fibrillation.  He takes a statin for hypercholesterolemia.  He is a current smoker. PAST MEDICAL HISTORY:   Past Medical History:  Diagnosis Date  . Allergy   . Anemia   . Anxiety   . Arthritis   . Cataract   . Depression   . Hypertension   . Myocardial infarction [facility])      FAMILY HISTORY:   Family History  Problem Relation Age of Onset  . Diabetes Mother   . Cancer Father   . Hypertension Father   . Stroke Father     SOCIAL HISTORY:   Social History   Tobacco Use  . Smoking status: Current Every Day Smoker    Packs/day: 1.00    Types: Cigarettes  . Smokeless tobacco: Never Used  Substance Use Topics  . Alcohol use: Yes     ALLERGIES:   Allergies  Allergen Reactions  . Shellfish Allergy Diarrhea and Nausea And Vomiting    Oysters only      CURRENT MEDICATIONS:   Current Outpatient Medications  Medication Sig Dispense Refill  . allopurinol (ZYLOPRIM) 300 MG tablet Take 300 mg by mouth daily.    Marland Kitchen aspirin EC 81 MG tablet Take 81 mg by mouth  daily.    Marland Kitchen atorvastatin (LIPITOR) 40 MG tablet Take 1 tablet (40 mg total) by mouth daily. 90 tablet 3  . cilostazol (PLETAL) 100 MG tablet Take 1 tablet (100 mg total) by mouth 2 (two) times daily before a meal. 60 tablet 11  . ELIQUIS 5 MG TABS tablet     . metoprolol succinate (TOPROL-XL) 100 MG 24 hr tablet Take 100 mg by mouth daily. Take with or immediately following a meal.    . ramipril (ALTACE) 10 MG capsule Take 10 mg by mouth daily.    . sertraline (ZOLOFT) 25 MG tablet     . clopidogrel (PLAVIX) 75 MG tablet Take 75 mg by mouth daily with breakfast. (Patient not taking: No sig reported)     No current facility-administered medications for this visit.    REVIEW OF SYSTEMS:   [X] denotes positive finding, [ ] denotes negative finding Cardiac  Comments:  Chest pain or chest pressure:    Shortness of breath upon exertion:    Short of breath when lying flat:    Irregular heart rhythm:        Vascular    Pain in calf, thigh, or hip brought on by ambulation: x   Pain in feet at night that wakes you up from your sleep:  Blood clot in your veins:    Leg swelling:         Pulmonary    Oxygen at home:    Productive cough:     Wheezing:         Neurologic    Sudden weakness in arms or legs:     Sudden numbness in arms or legs:     Sudden onset of difficulty speaking or slurred speech:    Temporary loss of vision in one eye:     Problems with dizziness:         Gastrointestinal    Blood in stool:     Vomited blood:         Genitourinary    Burning when urinating:     Blood in urine:        Psychiatric    Major depression:         Hematologic    Bleeding problems:    Problems with blood clotting too easily:        Skin    Rashes or ulcers:        Constitutional    Fever or chills:      PHYSICAL EXAM:   Vitals:   02/10/20 1028  BP: (!) 146/89  Pulse: (!) 116  Resp: 16  Temp: 97.9 F (36.6 C)  TempSrc: Temporal  SpO2: 97%  Weight: 181 lb (82.1  kg)  Height: 6' (1.829 m)    GENERAL: The patient is a well-nourished male, in no acute distress. The vital signs are documented above. CARDIAC: There is a regular rate and rhythm.  VASCULAR: Nonpalpable pedal pulses PULMONARY: Non-labored respirations MUSCULOSKELETAL: There are no major deformities or cyanosis. NEUROLOGIC: No focal weakness or paresthesias are detected. SKIN: There are no ulcers or rashes noted. PSYCHIATRIC: The patient has a normal affect.  STUDIES:   None  MEDICAL ISSUES:   PAD with claudication, right greater than left: The patient has failed conservative, nonoperative therapy.  His activities are severely limited by his symptoms, and he has early ulcerative changes to his right great toe.  We discussed proceeding with angiography which would be through a left femoral approach.  I will perform bilateral runoff and intervention on the right leg if amenable.  We discussed the details of the procedure as well as the risks and benefits.  Is been scheduled for Tuesday, February 1.  He will stop his Eliquis.    Leia Alf, MD, FACS [facility] Specialists of [facility] [phone] Pager [phone]

## 2020-02-14 ENCOUNTER — Other Ambulatory Visit
Admission: RE | Admit: 2020-02-14 | Discharge: 2020-02-14 | Disposition: A | Discharge status: Home / Self Care | Payer: Medicare HMO | Source: Ambulatory Visit | Attending: Surgery | Admitting: Surgery

## 2020-02-14 ENCOUNTER — Other Ambulatory Visit: Payer: Self-pay

## 2020-02-14 DIAGNOSIS — Z01812 Encounter for preprocedural laboratory examination: Secondary | ICD-10-CM | POA: Diagnosis not present

## 2020-02-14 DIAGNOSIS — Z20822 Contact with and (suspected) exposure to covid-19: Secondary | ICD-10-CM | POA: Diagnosis not present

## 2020-02-15 LAB — SARS CORONAVIRUS 2 (TAT 6-24 HRS): SARS Coronavirus 2: NEGATIVE

## 2020-02-18 ENCOUNTER — Ambulatory Visit (HOSPITAL_COMMUNITY)
Admission: RE | Admit: 2020-02-18 | Discharge: 2020-02-18 | Disposition: A | Discharge status: Home / Self Care | Payer: Medicare HMO | Attending: Surgery | Admitting: Surgery

## 2020-02-18 ENCOUNTER — Other Ambulatory Visit: Payer: Self-pay

## 2020-02-18 ENCOUNTER — Encounter (HOSPITAL_COMMUNITY)
Admission: RE | Disposition: A | Discharge status: Home / Self Care | Payer: Self-pay | Source: Home / Self Care | Attending: Surgery

## 2020-02-18 DIAGNOSIS — I70235 Atherosclerosis of native arteries of right leg with ulceration of other part of foot: Secondary | ICD-10-CM | POA: Insufficient documentation

## 2020-02-18 DIAGNOSIS — Z7902 Long term (current) use of antithrombotics/antiplatelets: Secondary | ICD-10-CM | POA: Insufficient documentation

## 2020-02-18 DIAGNOSIS — I70213 Atherosclerosis of native arteries of extremities with intermittent claudication, bilateral legs: Secondary | ICD-10-CM | POA: Insufficient documentation

## 2020-02-18 DIAGNOSIS — Z7901 Long term (current) use of anticoagulants: Secondary | ICD-10-CM | POA: Diagnosis not present

## 2020-02-18 DIAGNOSIS — Z7982 Long term (current) use of aspirin: Secondary | ICD-10-CM | POA: Diagnosis not present

## 2020-02-18 DIAGNOSIS — F17210 Nicotine dependence, cigarettes, uncomplicated: Secondary | ICD-10-CM | POA: Diagnosis not present

## 2020-02-18 DIAGNOSIS — Z91013 Allergy to seafood: Secondary | ICD-10-CM | POA: Insufficient documentation

## 2020-02-18 DIAGNOSIS — Z79899 Other long term (current) drug therapy: Secondary | ICD-10-CM | POA: Insufficient documentation

## 2020-02-18 DIAGNOSIS — L97519 Non-pressure chronic ulcer of other part of right foot with unspecified severity: Secondary | ICD-10-CM | POA: Insufficient documentation

## 2020-02-18 HISTORY — PX: ABDOMINAL AORTOGRAM W/LOWER EXTREMITY: CATH118223

## 2020-02-18 HISTORY — PX: PERIPHERAL VASCULAR INTERVENTION: CATH118257

## 2020-02-18 LAB — POCT I-STAT, CHEM 8
BUN: 15 mg/dL (ref 8–23)
Calcium, Ion: 1.25 mmol/L (ref 1.15–1.40)
Chloride: 103 mmol/L (ref 98–111)
Creatinine, Ser: 1.2 mg/dL (ref 0.61–1.24)
Glucose, Bld: 84 mg/dL (ref 70–99)
HCT: 39 % (ref 39.0–52.0)
Hemoglobin: 13.3 g/dL (ref 13.0–17.0)
Potassium: 4.7 mmol/L (ref 3.5–5.1)
Sodium: 138 mmol/L (ref 135–145)
TCO2: 29 mmol/L (ref 22–32)

## 2020-02-18 LAB — POCT ACTIVATED CLOTTING TIME: Activated Clotting Time: 273 seconds

## 2020-02-18 SURGERY — ABDOMINAL AORTOGRAM W/LOWER EXTREMITY
Anesthesia: LOCAL | Laterality: Right

## 2020-02-18 MED ORDER — OXYCODONE HCL 5 MG PO TABS: 5.0000 mg | ORAL_TABLET | ORAL | Status: DC | PRN

## 2020-02-18 MED ORDER — CLOPIDOGREL BISULFATE 75 MG PO TABS: 75.0000 mg | ORAL_TABLET | Freq: Every day | ORAL | Status: DC

## 2020-02-18 MED ORDER — LIDOCAINE HCL (PF) 1 % IJ SOLN
INTRAMUSCULAR | Status: DC | PRN
  Administered 2020-02-18: 18 mL

## 2020-02-18 MED ORDER — LABETALOL HCL 5 MG/ML IV SOLN: 10.0000 mg | INTRAVENOUS | Status: DC | PRN

## 2020-02-18 MED ORDER — HEPARIN SODIUM (PORCINE) 1000 UNIT/ML IJ SOLN
INTRAMUSCULAR | Status: DC | PRN
  Administered 2020-02-18: 8000 [IU] via INTRAVENOUS

## 2020-02-18 MED ORDER — CLOPIDOGREL BISULFATE 300 MG PO TABS
ORAL_TABLET | ORAL | Status: DC | PRN
  Administered 2020-02-18: 300 mg via ORAL

## 2020-02-18 MED ORDER — HEPARIN (PORCINE) IN NACL 1000-0.9 UT/500ML-% IV SOLN
INTRAVENOUS | Status: DC | PRN
  Administered 2020-02-18 (×2): 500 mL

## 2020-02-18 MED ORDER — SODIUM CHLORIDE 0.9 % IV SOLN: 250.0000 mL | INTRAVENOUS | Status: DC | PRN

## 2020-02-18 MED ORDER — LIDOCAINE HCL (PF) 1 % IJ SOLN
INTRAMUSCULAR | Status: AC
  Filled 2020-02-18: qty 30

## 2020-02-18 MED ORDER — CLOPIDOGREL BISULFATE 300 MG PO TABS
ORAL_TABLET | ORAL | Status: AC
  Filled 2020-02-18: qty 1

## 2020-02-18 MED ORDER — ONDANSETRON HCL 4 MG/2ML IJ SOLN: 4.0000 mg | Freq: Four times a day (QID) | INTRAMUSCULAR | Status: DC | PRN

## 2020-02-18 MED ORDER — SODIUM CHLORIDE 0.9 % IV SOLN: INTRAVENOUS | Status: DC

## 2020-02-18 MED ORDER — SODIUM CHLORIDE 0.9% FLUSH: 3.0000 mL | INTRAVENOUS | Status: DC | PRN

## 2020-02-18 MED ORDER — CLOPIDOGREL BISULFATE 75 MG PO TABS: 300.0000 mg | ORAL_TABLET | Freq: Once | ORAL | Status: DC

## 2020-02-18 MED ORDER — CLOPIDOGREL BISULFATE 75 MG PO TABS: 75.0000 mg | ORAL_TABLET | Freq: Every day | ORAL | 11 refills | Status: AC

## 2020-02-18 MED ORDER — HEPARIN SODIUM (PORCINE) 1000 UNIT/ML IJ SOLN
INTRAMUSCULAR | Status: AC
  Filled 2020-02-18: qty 1

## 2020-02-18 MED ORDER — FENTANYL CITRATE (PF) 100 MCG/2ML IJ SOLN
INTRAMUSCULAR | Status: DC | PRN
  Administered 2020-02-18: 50 ug via INTRAVENOUS

## 2020-02-18 MED ORDER — MIDAZOLAM HCL 2 MG/2ML IJ SOLN
INTRAMUSCULAR | Status: AC
  Filled 2020-02-18: qty 2

## 2020-02-18 MED ORDER — MORPHINE SULFATE (PF) 2 MG/ML IV SOLN: 2.0000 mg | INTRAVENOUS | Status: DC | PRN

## 2020-02-18 MED ORDER — HEPARIN (PORCINE) IN NACL 1000-0.9 UT/500ML-% IV SOLN
INTRAVENOUS | Status: AC
  Filled 2020-02-18: qty 1000

## 2020-02-18 MED ORDER — FENTANYL CITRATE (PF) 100 MCG/2ML IJ SOLN
INTRAMUSCULAR | Status: AC
  Filled 2020-02-18: qty 2

## 2020-02-18 MED ORDER — IODIXANOL 320 MG/ML IV SOLN
INTRAVENOUS | Status: DC | PRN
  Administered 2020-02-18: 90 mL

## 2020-02-18 MED ORDER — MIDAZOLAM HCL 2 MG/2ML IJ SOLN
INTRAMUSCULAR | Status: DC | PRN
  Administered 2020-02-18: 2 mg via INTRAVENOUS

## 2020-02-18 MED ORDER — SODIUM CHLORIDE 0.9% FLUSH: 3.0000 mL | Freq: Two times a day (BID) | INTRAVENOUS | Status: DC

## 2020-02-18 MED ORDER — HYDRALAZINE HCL 20 MG/ML IJ SOLN: 5.0000 mg | INTRAMUSCULAR | Status: DC | PRN

## 2020-02-18 MED ORDER — ASPIRIN EC 81 MG PO TBEC: 81.0000 mg | DELAYED_RELEASE_TABLET | Freq: Every day | ORAL | Status: DC

## 2020-02-18 MED ORDER — ACETAMINOPHEN 325 MG PO TABS: 650.0000 mg | ORAL_TABLET | ORAL | Status: DC | PRN

## 2020-02-18 MED ORDER — SODIUM CHLORIDE 0.9 % WEIGHT BASED INFUSION: 1.0000 mL/kg/h | INTRAVENOUS | Status: DC

## 2020-02-18 SURGICAL SUPPLY — 26 items
BAG SNAP BAND KOVER 36X36 (MISCELLANEOUS) ×1 IMPLANT
BALLN MUSTANG 4X100X135 (BALLOONS) ×6
BALLN MUSTANG 5X200X135 (BALLOONS) ×3
BALLOON MUSTANG 4X100X135 (BALLOONS) IMPLANT
BALLOON MUSTANG 5X200X135 (BALLOONS) IMPLANT
CATH ANGIO 5F BER2 65CM (CATHETERS) ×1 IMPLANT
CATH NAVICROSS ST .035X90CM (MICROCATHETER) ×1 IMPLANT
CATH OMNI FLUSH 5F 65CM (CATHETERS) ×1 IMPLANT
CATH QUICKCROSS .035X135CM (MICROCATHETER) ×1 IMPLANT
CLOSURE MYNX CONTROL 6F/7F (Vascular Products) ×1 IMPLANT
COVER DOME SNAP 22 D (MISCELLANEOUS) ×2 IMPLANT
DEVICE TORQUE H2O (MISCELLANEOUS) ×1 IMPLANT
GLIDEWIRE ADV .035X260CM (WIRE) ×1 IMPLANT
KIT MICROPUNCTURE NIT STIFF (SHEATH) ×1 IMPLANT
KIT PV (KITS) ×3 IMPLANT
SHEATH FLEX ANSEL ANG 6F 45CM (SHEATH) ×1 IMPLANT
SHEATH PINNACLE 5F 10CM (SHEATH) ×1 IMPLANT
SHEATH PINNACLE 6F 10CM (SHEATH) ×1 IMPLANT
SHIELD RADPAD SCOOP 12X17 (MISCELLANEOUS) ×1 IMPLANT
STENT ELUVIA 6X120X130 (Permanent Stent) ×2 IMPLANT
SYR MEDRAD MARK V 150ML (SYRINGE) ×1 IMPLANT
TAPE VIPERTRACK RADIOPAQ (MISCELLANEOUS) IMPLANT
TAPE VIPERTRACK RADIOPAQUE (MISCELLANEOUS) ×3
TRANSDUCER W/STOPCOCK (MISCELLANEOUS) ×3 IMPLANT
TRAY PV CATH (CUSTOM PROCEDURE TRAY) ×3 IMPLANT
WIRE STARTER BENTSON 035X150 (WIRE) ×1 IMPLANT

## 2020-02-18 NOTE — Interval H&P Note (Signed)
History and Physical Interval Note:  02/18/2020 7:35 AM  [name]  has presented today for surgery, with the diagnosis of pvd.  The various methods of treatment have been discussed with the patient and family. After consideration of risks, benefits and other options for treatment, the patient has consented to  Procedure(s): ABDOMINAL AORTOGRAM W/LOWER EXTREMITY (N/A) as a surgical intervention.  The patient's history has been reviewed, patient examined, no change in status, stable for surgery.  I have reviewed the patient's chart and labs.  Questions were answered to the patient's satisfaction.     Annamarie Major

## 2020-02-18 NOTE — Discharge Instructions (Signed)
Angiogram, Care After This sheet gives you information about how to care for yourself after your procedure. Your health care provider may also give you more specific instructions. If you have problems or questions, contact your health care provider. What can I expect after the procedure? After the procedure, it is common to have:  Bruising and tenderness at the catheter insertion area.  A collection of blood (hematoma) at the insertion area. This may feel like a small lump under the skin at the insertion site. Follow these instructions at home: Insertion site care  Follow instructions from your health care provider about how to take care of your insertion site. Make sure you: ? Wash your hands with soap and water before and after you change your bandage (dressing). If soap and water are not available, use hand sanitizer. ? Change your dressing as told by your health care provider.  Do not take baths, swim, or use a hot tub until your health care provider approves.  You may shower 24-48 hours after the procedure, or as told by your health care provider. To clean the insertion site: ? Gently wash the area with plain soap and water. ? Pat the area dry with a clean towel. ? Do not rub the site. This may cause bleeding.  Check your insertion site every day for signs of infection. Check for: ? Redness, swelling, or pain. ? Fluid or blood. ? Warmth. ? Pus or a bad smell.  Do not apply powder or lotion to the site. Keep the site clean and dry.   Activity  Do not drive for 24 hours if you were given a sedative during your procedure.  Rest as told by your health care provider, usually for 1-2 days.  Do not lift anything that is heavier than 10 lb (4.5 kg), or the limit that you are told, until your health care provider says that it is safe.  If the insertion site was in your leg, try to avoid stairs for a few days.  Return to your normal activities as told by your health care provider,  usually in about a week. Ask your health care provider what activities are safe for you. General instructions  If your insertion site starts bleeding, lie flat and put pressure on the site. If the bleeding does not stop, get help right away. This is a medical emergency.  Take over-the-counter and prescription medicines only as told by your health care provider.  Drink enough fluid to keep your urine pale yellow. This helps flush the contrast dye from your body.  Keep all follow-up visits as told by your health care provider. This is important.   Contact a health care provider if:  You have a fever or chills.  You have redness, swelling, or pain around your insertion site.  You have fluid or blood coming from your insertion site.  Your insertion site feels warm to the touch.  You have pus or a bad smell coming from your insertion site.  You have more bruising around the insertion site. Get help right away if you have:  A problem with the insertion area, such as: ? The area swells fast or bleeds even after you apply pressure. ? The area becomes pale, cool, tingly, or numb.  Chest pain.  Trouble breathing.  A rash.  Any symptoms of a stroke. "BE FAST" is an easy way to remember the main warning signs of a stroke: ? B - Balance. Signs are dizziness, sudden trouble walking,   or loss of balance. ? E - Eyes. Signs are trouble seeing or a sudden change in vision. ? F - Face. Signs are sudden weakness or loss of feeling of the face, or the face or eyelid drooping on one side. ? A - Arms. Signs are weakness or loss of feeling in an arm. This happens suddenly and usually on one side of the body. ? S - Speech. Signs are sudden trouble speaking, slurred speech, or trouble understanding what people say. ? T - Time. Time to call emergency services. Write down what time symptoms started.  You have other signs of a stroke, such as: ? A sudden, severe headache with no known cause. ? Nausea  or vomiting. ? Seizure. These symptoms may represent a serious problem that is an emergency. Do not wait to see if the symptoms will go away. Get medical help right away. Call your local emergency services (911 in the [place] Do not drive yourself to the hospital. Summary  It is common to have bruising and tenderness at the catheter insertion area.  Do not take baths, swim, or use a hot tub until your health care provider approves. You may shower 24-48 hours after the procedure or as told.  It is important to rest and drink plenty of fluids.  If the insertion site bleeds, lie flat and put pressure on the site. If the bleeding continues, get help right away. This is a medical emergency. This information is not intended to replace advice given to you by your health care provider. Make sure you discuss any questions you have with your health care provider. Document Revised: 11/07/2018 Document Reviewed: 11/07/2018 Elsevier Patient Education  2021 Elsevier Inc.  

## 2020-02-18 NOTE — Op Note (Signed)
Patient name: [name] MRN: [id number] DOB: 10-03-1950 Sex: male  02/18/2020 Pre-operative Diagnosis: Right toe ulcer, bilateral claudication Post-operative diagnosis:  Same Surgeon:  Annamarie Major Procedure Performed:  1.  Ultrasound-guided access, left femoral artery  2.  Abdominal aortogram  3.  Bilateral lower extremity runoff  4.  Stent, right superficial femoral artery  5.  Closure device, Mynx  6.  Conscious sedation, 71 minutes    Indications: The patient suffers from severe bilateral claudication which failed medical therapy.  He is also getting early ulceration to his right great toe.  He comes in today for further evaluation and possible invention.  Procedure:  The patient was identified in the holding area and taken to room 8.  The patient was then placed supine on the table and prepped and draped in the usual sterile fashion.  A time out was called.  Conscious sedation was administered with the use of IV fentanyl and Versed under continuous physician and nurse monitoring.  Heart rate, blood pressure, and oxygen saturation were continuously monitored.  Total sedation time was 71 minutes.  Ultrasound was used to evaluate the left common femoral artery.  It was patent .  A digital ultrasound image was acquired.  A micropuncture needle was used to access the left common femoral artery under ultrasound guidance.  An 018 wire was advanced without resistance and a micropuncture sheath was placed.  The 018 wire was removed and a benson wire was placed.  The micropuncture sheath was exchanged for a 5 french sheath.  An omniflush catheter was advanced over the wire to the level of L-1.  An abdominal angiogram was obtained.  Next, using the omniflush catheter and a benson wire, the aortic bifurcation was crossed and the catheter was placed into theright external iliac artery and right runoff was obtained.  left runoff was performed via retrograde sheath injections.  Findings:    Aortogram: No significant renal artery stenosis was identified.  The infrarenal abdominal aorta is widely patent without stenosis.  Bilateral common and external iliac arteries are calcified but patent without significant stenosis.  Right Lower Extremity: The right common femoral artery is widely patent.  There are small profunda branches.  The superficial femoral artery is patent proximally however it occludes in the adductor canal and then reconstitutes the above-knee popliteal artery which also has a subtotal occlusion.  There is a greater than 70% focal stenosis in the below-knee popliteal artery with two-vessel runoff via the posterior tibial peroneal artery.  Left Lower Extremity: Left common femoral and profundofemoral artery pain throughout the course.  Superficial femoral artery is occluded in the adductor canal with reconstitution of the above-knee popliteal artery  Intervention: After the above images were acquired the decision was made to proceed with intervention.  Over a Glidewire advantage, a 6 French 45 cm sheath was advanced into the right external iliac artery.  The patient is fully heparinized.  Using a 035 quick cross and the Glidewire advantage, the lesions were successfully crossed.  The occluded area was predilated with a 4 mm balloon.  I then elected to primarily stent this using 2 overlapping 6 x 120 Elluvia stents.  These were postdilated with a 5 mm balloon.  Completion imaging showed resolution of the stenosis of the superficial femoral artery.  The groin was then closed with a minx.  There were no immediate complications.  Impression:  #1  Right superficial femoral artery occlusion successfully crossed and treated with overlapping 6 x 120  Elluvia stents.  #2  Occluded left superficial femoral artery.  The patient be brought back for intervention.   Theotis Burrow, M.D., [facility] of [place] Office: [phone] Pager:  [phone]

## 2020-02-19 ENCOUNTER — Encounter (HOSPITAL_COMMUNITY): Payer: Self-pay | Admitting: Surgery

## 2020-02-20 ENCOUNTER — Other Ambulatory Visit: Payer: Self-pay

## 2020-02-20 DIAGNOSIS — I70213 Atherosclerosis of native arteries of extremities with intermittent claudication, bilateral legs: Secondary | ICD-10-CM

## 2020-02-21 ENCOUNTER — Other Ambulatory Visit: Payer: Self-pay

## 2020-02-21 NOTE — Progress Notes (Signed)
error 

## 2020-03-03 ENCOUNTER — Other Ambulatory Visit: Payer: Self-pay

## 2020-03-09 ENCOUNTER — Other Ambulatory Visit (HOSPITAL_COMMUNITY)
Admission: RE | Admit: 2020-03-09 | Discharge: 2020-03-09 | Disposition: A | Discharge status: Home / Self Care | Payer: Medicare HMO | Source: Ambulatory Visit | Attending: Surgery | Admitting: Surgery

## 2020-03-09 DIAGNOSIS — Z01812 Encounter for preprocedural laboratory examination: Secondary | ICD-10-CM | POA: Diagnosis not present

## 2020-03-09 DIAGNOSIS — Z20822 Contact with and (suspected) exposure to covid-19: Secondary | ICD-10-CM | POA: Insufficient documentation

## 2020-03-09 LAB — SARS CORONAVIRUS 2 (TAT 6-24 HRS): SARS Coronavirus 2: NEGATIVE

## 2020-03-10 ENCOUNTER — Ambulatory Visit (HOSPITAL_COMMUNITY)
Admission: RE | Admit: 2020-03-10 | Discharge: 2020-03-10 | Disposition: A | Discharge status: Home / Self Care | Payer: Medicare HMO | Attending: Surgery | Admitting: Surgery

## 2020-03-10 ENCOUNTER — Other Ambulatory Visit: Payer: Self-pay

## 2020-03-10 ENCOUNTER — Encounter (HOSPITAL_COMMUNITY)
Admission: RE | Disposition: A | Discharge status: Home / Self Care | Payer: Self-pay | Source: Home / Self Care | Attending: Surgery

## 2020-03-10 DIAGNOSIS — Z79899 Other long term (current) drug therapy: Secondary | ICD-10-CM | POA: Insufficient documentation

## 2020-03-10 DIAGNOSIS — Z951 Presence of aortocoronary bypass graft: Secondary | ICD-10-CM | POA: Diagnosis not present

## 2020-03-10 DIAGNOSIS — I4891 Unspecified atrial fibrillation: Secondary | ICD-10-CM | POA: Insufficient documentation

## 2020-03-10 DIAGNOSIS — E7800 Pure hypercholesterolemia, unspecified: Secondary | ICD-10-CM | POA: Diagnosis not present

## 2020-03-10 DIAGNOSIS — Z91013 Allergy to seafood: Secondary | ICD-10-CM | POA: Insufficient documentation

## 2020-03-10 DIAGNOSIS — Z7902 Long term (current) use of antithrombotics/antiplatelets: Secondary | ICD-10-CM | POA: Insufficient documentation

## 2020-03-10 DIAGNOSIS — I70212 Atherosclerosis of native arteries of extremities with intermittent claudication, left leg: Secondary | ICD-10-CM | POA: Diagnosis not present

## 2020-03-10 DIAGNOSIS — Z7982 Long term (current) use of aspirin: Secondary | ICD-10-CM | POA: Diagnosis not present

## 2020-03-10 DIAGNOSIS — F17210 Nicotine dependence, cigarettes, uncomplicated: Secondary | ICD-10-CM | POA: Diagnosis not present

## 2020-03-10 DIAGNOSIS — Z7901 Long term (current) use of anticoagulants: Secondary | ICD-10-CM | POA: Diagnosis not present

## 2020-03-10 DIAGNOSIS — I2510 Atherosclerotic heart disease of native coronary artery without angina pectoris: Secondary | ICD-10-CM | POA: Diagnosis not present

## 2020-03-10 HISTORY — PX: ABDOMINAL AORTOGRAM W/LOWER EXTREMITY: CATH118223

## 2020-03-10 LAB — POCT I-STAT, CHEM 8
BUN: 12 mg/dL (ref 8–23)
Calcium, Ion: 1.19 mmol/L (ref 1.15–1.40)
Chloride: 105 mmol/L (ref 98–111)
Creatinine, Ser: 1 mg/dL (ref 0.61–1.24)
Glucose, Bld: 89 mg/dL (ref 70–99)
HCT: 37 % — ABNORMAL LOW (ref 39.0–52.0)
Hemoglobin: 12.6 g/dL — ABNORMAL LOW (ref 13.0–17.0)
Potassium: 3.9 mmol/L (ref 3.5–5.1)
Sodium: 141 mmol/L (ref 135–145)
TCO2: 26 mmol/L (ref 22–32)

## 2020-03-10 LAB — POCT ACTIVATED CLOTTING TIME: Activated Clotting Time: 255 seconds

## 2020-03-10 SURGERY — ABDOMINAL AORTOGRAM W/LOWER EXTREMITY
Anesthesia: LOCAL

## 2020-03-10 MED ORDER — SODIUM CHLORIDE 0.9 % IV SOLN: INTRAVENOUS | Status: DC

## 2020-03-10 MED ORDER — SODIUM CHLORIDE 0.9% FLUSH: 3.0000 mL | INTRAVENOUS | Status: DC | PRN

## 2020-03-10 MED ORDER — SODIUM CHLORIDE 0.9 % WEIGHT BASED INFUSION: 1.0000 mL/kg/h | INTRAVENOUS | Status: DC

## 2020-03-10 MED ORDER — SODIUM CHLORIDE 0.9% FLUSH: 3.0000 mL | Freq: Two times a day (BID) | INTRAVENOUS | Status: DC

## 2020-03-10 MED ORDER — FENTANYL CITRATE (PF) 100 MCG/2ML IJ SOLN
INTRAMUSCULAR | Status: AC
  Filled 2020-03-10: qty 2

## 2020-03-10 MED ORDER — LABETALOL HCL 5 MG/ML IV SOLN: 10.0000 mg | INTRAVENOUS | Status: DC | PRN

## 2020-03-10 MED ORDER — HEPARIN SODIUM (PORCINE) 1000 UNIT/ML IJ SOLN
INTRAMUSCULAR | Status: AC
  Filled 2020-03-10: qty 1

## 2020-03-10 MED ORDER — HEPARIN SODIUM (PORCINE) 1000 UNIT/ML IJ SOLN
INTRAMUSCULAR | Status: DC | PRN
  Administered 2020-03-10: 8000 [IU] via INTRAVENOUS

## 2020-03-10 MED ORDER — MIDAZOLAM HCL 2 MG/2ML IJ SOLN
INTRAMUSCULAR | Status: DC | PRN
  Administered 2020-03-10: 2 mg via INTRAVENOUS

## 2020-03-10 MED ORDER — HYDRALAZINE HCL 20 MG/ML IJ SOLN: 5.0000 mg | INTRAMUSCULAR | Status: DC | PRN

## 2020-03-10 MED ORDER — LIDOCAINE HCL (PF) 1 % IJ SOLN
INTRAMUSCULAR | Status: AC
  Filled 2020-03-10: qty 30

## 2020-03-10 MED ORDER — SODIUM CHLORIDE 0.9 % IV SOLN: 250.0000 mL | INTRAVENOUS | Status: DC | PRN

## 2020-03-10 MED ORDER — HEPARIN (PORCINE) IN NACL 1000-0.9 UT/500ML-% IV SOLN
INTRAVENOUS | Status: DC | PRN
  Administered 2020-03-10 (×2): 500 mL

## 2020-03-10 MED ORDER — IODIXANOL 320 MG/ML IV SOLN
INTRAVENOUS | Status: DC | PRN
  Administered 2020-03-10: 65 mL

## 2020-03-10 MED ORDER — OXYCODONE HCL 5 MG PO TABS: 5.0000 mg | ORAL_TABLET | ORAL | Status: DC | PRN

## 2020-03-10 MED ORDER — HEPARIN (PORCINE) IN NACL 1000-0.9 UT/500ML-% IV SOLN
INTRAVENOUS | Status: AC
  Filled 2020-03-10: qty 1000

## 2020-03-10 MED ORDER — ONDANSETRON HCL 4 MG/2ML IJ SOLN: 4.0000 mg | Freq: Four times a day (QID) | INTRAMUSCULAR | Status: DC | PRN

## 2020-03-10 MED ORDER — MIDAZOLAM HCL 5 MG/5ML IJ SOLN
INTRAMUSCULAR | Status: AC
  Filled 2020-03-10: qty 5

## 2020-03-10 MED ORDER — ACETAMINOPHEN 325 MG PO TABS: 650.0000 mg | ORAL_TABLET | ORAL | Status: DC | PRN

## 2020-03-10 MED ORDER — MORPHINE SULFATE (PF) 2 MG/ML IV SOLN: 2.0000 mg | INTRAVENOUS | Status: DC | PRN

## 2020-03-10 MED ORDER — FENTANYL CITRATE (PF) 100 MCG/2ML IJ SOLN
INTRAMUSCULAR | Status: DC | PRN
  Administered 2020-03-10: 50 ug via INTRAVENOUS

## 2020-03-10 MED ORDER — LIDOCAINE HCL (PF) 1 % IJ SOLN
INTRAMUSCULAR | Status: DC | PRN
  Administered 2020-03-10: 20 mL via INTRADERMAL

## 2020-03-10 SURGICAL SUPPLY — 15 items
CATH NAVICROSS ANGLED 135CM (MICROCATHETER) ×1 IMPLANT
CATH OMNI FLUSH 5F 65CM (CATHETERS) ×1 IMPLANT
CATH QUICKCROSS SUPP .035X90CM (MICROCATHETER) ×1 IMPLANT
CLOSURE MYNX CONTROL 6F/7F (Vascular Products) ×1 IMPLANT
DEVICE TORQUE H2O (MISCELLANEOUS) ×1 IMPLANT
GLIDEWIRE ADV .035X260CM (WIRE) ×1 IMPLANT
GUIDEWIRE ANGLED .035X150CM (WIRE) ×1 IMPLANT
KIT MICROPUNCTURE NIT STIFF (SHEATH) ×1 IMPLANT
KIT PV (KITS) ×2 IMPLANT
SHEATH PINNACLE 6F 10CM (SHEATH) ×1 IMPLANT
SHEATH PINNACLE ST 6F 45CM (SHEATH) ×2 IMPLANT
TRANSDUCER W/STOPCOCK (MISCELLANEOUS) ×2 IMPLANT
TRAY PV CATH (CUSTOM PROCEDURE TRAY) ×2 IMPLANT
TUBING CIL FLEX 10 FLL-RA (TUBING) ×1 IMPLANT
WIRE STARTER BENTSON 035X150 (WIRE) ×1 IMPLANT

## 2020-03-10 NOTE — Progress Notes (Signed)
I spoke with the patient's wife tonight and explained that I was unsuccessful at recanalization of his left leg superficial femoral artery occlusion.  I discussed proceeding with bypass or a second attempt at percutaneous revascularization once his dissection plane heals.  We have elected to reattempt percutaneous revascularization in approximately 1 month.  Annamarie Major

## 2020-03-10 NOTE — Op Note (Signed)
    Patient name: CAS TRACZ MRN: [id number] DOB: 1950/10/23 Sex: male  03/10/2020 Pre-operative Diagnosis: Left leg claudication Post-operative diagnosis:  Same Surgeon:  Annamarie Major Procedure Performed:  1.  Ultrasound-guided access, right femoral artery  2.  Second-order catheterization  3.  Left lower extremity runoff  4.  Failed attempt at left leg angioplasty  5.  Conscious sedation 65 minutes    Indications: The patient has previously undergone revascularization of the right leg.  Comes in today for attempt at the left leg.  Procedure:  The patient was identified in the holding area and taken to room 8.  The patient was then placed supine on the table and prepped and draped in the usual sterile fashion.  A time out was called.  Conscious sedation was administered with the use of IV fentanyl and Versed under continuous physician and nurse monitoring.  Heart rate, blood pressure, and oxygen saturation were continuously monitored.  Total sedation time was 65 minutes.  Ultrasound was used to evaluate the right common femoral artery.  It was patent .  A digital ultrasound image was acquired.  A micropuncture needle was used to access the right common femoral artery under ultrasound guidance.  An 018 wire was advanced without resistance and a micropuncture sheath was placed.  The 018 wire was removed and a benson wire was placed.  The micropuncture sheath was exchanged for a 6 French 45 cm sheath which was advanced over the bifurcation and placed in the left external iliac artery and left leg runoff was performed.  Findings:    Right Lower Extremity: The left common femoral profundofemoral artery patent throughout the course.  Superficial femoral artery is patent proximally however occludes in the adductor canal with reconstitution just proximal to the joint space with two-vessel runoff via the posterior tibial and peroneal artery.  Plantar arch is not intact   Intervention: After the  above images were acquired the decision was made to proceed with intervention.  The patient is fully heparinized.  I used a 035 Glidewire and a quick cross and attempted to perform subintimal recanalization.  Also utilized a Glidewire advantage.  I ended up getting into a dissection plane.  After multiple failed attempts, a pathway had been created into the dissection plane and I could not find another pathway into the artery.  Because he is a claudicant, and has disease out onto his foot, I did not feel pedal access was warranted at this time.  The groin was closed with a minx  Impression:  #1  Unsuccessful attempt at recanalization of an occluded left superficial femoral artery  #2  I will bring the patient back in 1 month for a repeat attempt.  At that time, I could consider pedal access versus bypass.  He will probably be a better bypass candidate.     Theotis Burrow, M.D., [facility] Specialists of [place] Office: [phone] Pager:  [phone]

## 2020-03-10 NOTE — H&P (Signed)
Vascular and Vein Specialist of [place]  Patient name: [name]            MRN: [id number]        DOB: 1950-04-10          Sex: male   REASON FOR VISIT:    Follow up  HISOTRY OF PRESENT ILLNESS:    [name] is a 70 y.o. male who I met in September 2021 for evaluation of bilateral claudication.  The patient had been having progressively worse cramps in both calves with walking approximately half a block.  The right leg was more severe than the left.  We initially attempted to manage this nonoperatively, beginning with smoking cessation, and exercise program, cilostazol.  He is back for follow-up.  He continues to have significant difficulty with ambulation in his right leg.  He is also concerned about the appearance of his right thumbnail.  Patient has a history of cardiovascular disease. He is status post CABG at age 19. He has subsequently undergone PCI x3. He is on Eliquis for atrial fibrillation. He takes a statin for hypercholesterolemia. He is a current smoker. PAST MEDICAL HISTORY:       Past Medical History:  Diagnosis Date  . Allergy   . Anemia   . Anxiety   . Arthritis   . Cataract   . Depression   . Hypertension   . Myocardial infarction [facility])      FAMILY HISTORY:        Family History  Problem Relation Age of Onset  . Diabetes Mother   . Cancer Father   . Hypertension Father   . Stroke Father     SOCIAL HISTORY:   Social History        Tobacco Use  . Smoking status: Current Every Day Smoker    Packs/day: 1.00    Types: Cigarettes  . Smokeless tobacco: Never Used  Substance Use Topics  . Alcohol use: Yes     ALLERGIES:        Allergies  Allergen Reactions  . Shellfish Allergy Diarrhea and Nausea And Vomiting    Oysters only      CURRENT MEDICATIONS:         Current Outpatient Medications  Medication Sig Dispense Refill  . allopurinol  (ZYLOPRIM) 300 MG tablet Take 300 mg by mouth daily.    Marland Kitchen aspirin EC 81 MG tablet Take 81 mg by mouth daily.    Marland Kitchen atorvastatin (LIPITOR) 40 MG tablet Take 1 tablet (40 mg total) by mouth daily. 90 tablet 3  . cilostazol (PLETAL) 100 MG tablet Take 1 tablet (100 mg total) by mouth 2 (two) times daily before a meal. 60 tablet 11  . ELIQUIS 5 MG TABS tablet     . metoprolol succinate (TOPROL-XL) 100 MG 24 hr tablet Take 100 mg by mouth daily. Take with or immediately following a meal.    . ramipril (ALTACE) 10 MG capsule Take 10 mg by mouth daily.    . sertraline (ZOLOFT) 25 MG tablet     . clopidogrel (PLAVIX) 75 MG tablet Take 75 mg by mouth daily with breakfast. (Patient not taking: No sig reported)     No current facility-administered medications for this visit.    REVIEW OF SYSTEMS:   [X]  denotes positive finding, [ ]  denotes negative finding Cardiac  Comments:  Chest pain or chest pressure:    Shortness of breath upon exertion:    Short of breath when  lying flat:    Irregular heart rhythm:        Vascular    Pain in calf, thigh, or hip brought on by ambulation: x   Pain in feet at night that wakes you up from your sleep:     Blood clot in your veins:    Leg swelling:         Pulmonary    Oxygen at home:    Productive cough:     Wheezing:         Neurologic    Sudden weakness in arms or legs:     Sudden numbness in arms or legs:     Sudden onset of difficulty speaking or slurred speech:    Temporary loss of vision in one eye:     Problems with dizziness:         Gastrointestinal    Blood in stool:     Vomited blood:         Genitourinary    Burning when urinating:     Blood in urine:        Psychiatric    Major depression:         Hematologic    Bleeding problems:    Problems with blood clotting too easily:        Skin    Rashes or ulcers:         Constitutional    Fever or chills:      PHYSICAL EXAM:      Vitals:   02/10/20 1028  BP: (!) 146/89  Pulse: (!) 116  Resp: 16  Temp: 97.9 F (36.6 C)  TempSrc: Temporal  SpO2: 97%  Weight: 181 lb (82.1 kg)  Height: 6' (1.829 m)    GENERAL: The patient is a well-nourished male, in no acute distress. The vital signs are documented above. CARDIAC: There is a regular rate and rhythm.  VASCULAR: Nonpalpable pedal pulses PULMONARY: Non-labored respirations MUSCULOSKELETAL: There are no major deformities or cyanosis. NEUROLOGIC: No focal weakness or paresthesias are detected. SKIN: There are no ulcers or rashes noted. PSYCHIATRIC: The patient has a normal affect.  STUDIES:   None  MEDICAL ISSUES:   PAD with claudication, right greater than left: The patient has failed conservative, nonoperative therapy.  His activities are severely limited by his symptoms, and he has early ulcerative changes to his right great toe.  We discussed proceeding with angiography which would be through a left femoral approach.  I will perform bilateral runoff and intervention on the right leg if amenable.  We discussed the details of the procedure as well as the risks and benefits.  Is been scheduled for Tuesday, February 1.  He will stop his Eliquis.    Leia Alf, MD, FACS [facility] of [facility] [phone] Pager [phone]   Still with claudication CV:RRR PULM:CTA Abd soft Plan angio with intervention.  All questions answered  Annamarie Major

## 2020-03-10 NOTE — Discharge Instructions (Signed)
Femoral Site Care  This sheet gives you information about how to care for yourself after your procedure. Your health care provider may also give you more specific instructions. If you have problems or questions, contact your health care provider. What can I expect after the procedure? After the procedure, it is common to have:  Bruising that usually fades within 1-2 weeks.  Tenderness at the site. Follow these instructions at home: Wound care  Follow instructions from your health care provider about how to take care of your insertion site. Make sure you: ? Wash your hands with soap and water before you change your bandage (dressing). If soap and water are not available, use hand sanitizer. ? Change your dressing as told by your health care provider. ? Leave stitches (sutures), skin glue, or adhesive strips in place. These skin closures may need to stay in place for 2 weeks or longer. If adhesive strip edges start to loosen and curl up, you may trim the loose edges. Do not remove adhesive strips completely unless your health care provider tells you to do that.  Do not take baths, swim, or use a hot tub until your health care provider approves.  You may shower 24-48 hours after the procedure or as told by your health care provider. ? Gently wash the site with plain soap and water. ? Pat the area dry with a clean towel. ? Do not rub the site. This may cause bleeding.  Do not apply powder or lotion to the site. Keep the site clean and dry.  Check your femoral site every day for signs of infection. Check for: ? Redness, swelling, or pain. ? Fluid or blood. ? Warmth. ? Pus or a bad smell. Activity  For the first 2-3 days after your procedure, or as long as directed: ? Avoid climbing stairs as much as possible. ? Do not squat.  Do not lift anything that is heavier than 10 lb (4.5 kg), or the limit that you are told, until your health care provider says that it is safe.  Rest as  directed. ? Avoid sitting for a long time without moving. Get up to take short walks every 1-2 hours.  Do not drive for 24 hours if you were given a medicine to help you relax (sedative). General instructions  Take over-the-counter and prescription medicines only as told by your health care provider.  Keep all follow-up visits as told by your health care provider. This is important. Contact a health care provider if you have:  A fever or chills.  You have redness, swelling, or pain around your insertion site. Get help right away if:  The catheter insertion area swells very fast.  You pass out.  You suddenly start to sweat or your skin gets clammy.  The catheter insertion area is bleeding, and the bleeding does not stop when you hold steady pressure on the area.  The area near or just beyond the catheter insertion site becomes pale, cool, tingly, or numb. These symptoms may represent a serious problem that is an emergency. Do not wait to see if the symptoms will go away. Get medical help right away. Call your local emergency services (911 in the [place] Do not drive yourself to the hospital. Summary  After the procedure, it is common to have bruising that usually fades within 1-2 weeks.  Check your femoral site every day for signs of infection.  Do not lift anything that is heavier than 10 lb (4.5 kg), or   the limit that you are told, until your health care provider says that it is safe. This information is not intended to replace advice given to you by your health care provider. Make sure you discuss any questions you have with your health care provider. Document Revised: 09/06/2019 Document Reviewed: 09/06/2019 Elsevier Patient Education  2021 Elsevier Inc.  

## 2020-03-11 ENCOUNTER — Other Ambulatory Visit: Payer: Self-pay

## 2020-03-11 ENCOUNTER — Encounter (HOSPITAL_COMMUNITY): Payer: Self-pay | Admitting: Surgery

## 2020-03-16 ENCOUNTER — Ambulatory Visit (INDEPENDENT_AMBULATORY_CARE_PROVIDER_SITE_OTHER)
Admission: RE | Admit: 2020-03-16 | Discharge: 2020-03-16 | Disposition: A | Discharge status: Home / Self Care | Payer: Medicare HMO | Source: Ambulatory Visit | Attending: Surgery | Admitting: Surgery

## 2020-03-16 ENCOUNTER — Ambulatory Visit (INDEPENDENT_AMBULATORY_CARE_PROVIDER_SITE_OTHER): Payer: Medicare HMO | Admitting: Physician Assistant

## 2020-03-16 ENCOUNTER — Other Ambulatory Visit: Payer: Self-pay

## 2020-03-16 ENCOUNTER — Ambulatory Visit (HOSPITAL_COMMUNITY)
Admission: RE | Admit: 2020-03-16 | Discharge: 2020-03-16 | Disposition: A | Discharge status: Home / Self Care | Payer: Medicare HMO | Source: Ambulatory Visit | Attending: Surgery | Admitting: Surgery

## 2020-03-16 VITALS — BP 141/71 | HR 56 | Temp 98.0°F | Resp 20 | Ht 72.0 in | Wt 180.8 lb

## 2020-03-16 DIAGNOSIS — I70213 Atherosclerosis of native arteries of extremities with intermittent claudication, bilateral legs: Secondary | ICD-10-CM

## 2020-03-16 NOTE — Progress Notes (Signed)
Office Note     CC:  follow up Requesting Provider:  Charolette Forward, MD  HPI: [name] is a 70 y.o. (1950/03/12) male who presents for follow-up after aortogram with attempt at left lower extremity intervention due to intermittent claudication Dr. Trula Slade.  Findings included patency of the superficial femoral artery proximally however it occluded in the adductor canal with reconstitution just proximal to the joint space with two-vessel runoff via posterior tibial and peroneal arteries.  An attempt to perform subintimal recannulization was performed.  A dissection plane developed, the procedure was aborted after multiple attempts.  The patient had previously undergone right lower extremity intervention with SFA stent placement. February 18, 2020.  He states much improvement in right lower extremity claudication.  His claudication symptoms persist on the left and he has a degree of rest pain.  He is compliant with aspirin, statin and Plavix.  He is also on Eliquis twice daily. The patient states he is stopped smoking 2 weeks ago. He is not diabetic  Past Medical History:  Diagnosis Date  . Allergy   . Anemia   . Anxiety   . Arthritis   . Cataract   . Depression   . Hypertension   . Myocardial infarction [facility])     Past Surgical History:  Procedure Laterality Date  . ABDOMINAL AORTOGRAM W/LOWER EXTREMITY Bilateral 02/18/2020   Procedure: ABDOMINAL AORTOGRAM W/LOWER EXTREMITY;  Surgeon: Serafina Mitchell, MD;  Location: [place] [facility];  Service: Cardiovascular;  Laterality: Bilateral;  . ABDOMINAL AORTOGRAM W/LOWER EXTREMITY N/A 03/10/2020   Procedure: ABDOMINAL AORTOGRAM W/LOWER EXTREMITY;  Surgeon: Serafina Mitchell, MD;  Location: [place] [facility];  Service: Cardiovascular;  Laterality: N/A;  . CORONARY ARTERY BYPASS GRAFT    . EYE SURGERY    . FRACTURE SURGERY    . LEFT HEART CATHETERIZATION WITH CORONARY/GRAFT ANGIOGRAM N/A 07/09/2013   Procedure: LEFT HEART  CATHETERIZATION WITH Beatrix Fetters;  Surgeon: Clent Demark, MD;  Location: [facility] C[facility];  Service: Cardiovascular;  Laterality: N/A;  . PERIPHERAL VASCULAR INTERVENTION Right 02/18/2020   Procedure: PERIPHERAL VASCULAR INTERVENTION;  Surgeon: Serafina Mitchell, MD;  Location: [place] [facility];  Service: Cardiovascular;  Laterality: Right;  SFA    Social History   Socioeconomic History  . Marital status: Married    Spouse name: Not on file  . Number of children: Not on file  . Years of education: Not on file  . Highest education level: Not on file  Occupational History  . Not on file  Tobacco Use  . Smoking status: Current Every Day Smoker    Packs/day: 1.00    Types: Cigarettes  . Smokeless tobacco: Never Used  Vaping Use  . Vaping Use: Never used  Substance and Sexual Activity  . Alcohol use: Yes  . Drug use: No  . Sexual activity: Yes  Other Topics Concern  . Not on file  Social History Narrative  . Not on file   Social Determinants of Health   Financial Resource Strain: Not on file  Food Insecurity: Not on file  Transportation Needs: Not on file  Physical Activity: Not on file  Stress: Not on file  Social Connections: Not on file  Intimate Partner Violence: Not on file   Family History  Problem Relation Age of Onset  . Diabetes Mother   . Cancer Father   . Hypertension Father   . Stroke Father     Current Outpatient Medications  Medication Sig Dispense  Refill  . allopurinol (ZYLOPRIM) 300 MG tablet Take 300 mg by mouth daily.    Marland Kitchen amiodarone (PACERONE) 200 MG tablet Take 200 mg by mouth daily.    Marland Kitchen aspirin EC 81 MG tablet Take 81 mg by mouth daily.    Marland Kitchen atorvastatin (LIPITOR) 40 MG tablet Take 1 tablet (40 mg total) by mouth daily. (Patient taking differently: Take 40 mg by mouth every evening.) 90 tablet 3  . cilostazol (PLETAL) 100 MG tablet Take 1 tablet (100 mg total) by mouth 2 (two) times daily before a meal. (Patient taking differently:  Take 100 mg by mouth daily.) 60 tablet 11  . clopidogrel (PLAVIX) 75 MG tablet Take 1 tablet (75 mg total) by mouth daily. 30 tablet 11  . ELIQUIS 5 MG TABS tablet Take 5 mg by mouth 2 (two) times daily.    Marland Kitchen losartan (COZAAR) 25 MG tablet Take 25 mg by mouth daily.    . metoprolol succinate (TOPROL-XL) 100 MG 24 hr tablet Take 100 mg by mouth 2 (two) times daily. Take with or immediately following a meal.    . Multiple Vitamins-Minerals (MULTIVITAMIN WITH MINERALS) tablet Take 1 tablet by mouth 4 (four) times a week. One a day    . nicotine polacrilex (COMMIT) 4 MG lozenge Take 4 mg by mouth as needed for smoking cessation.    . nitroGLYCERIN (NITROSTAT) 0.4 MG SL tablet Place 0.4 mg under the tongue every 5 (five) minutes x 3 doses as needed for chest pain.    Marland Kitchen sertraline (ZOLOFT) 25 MG tablet Take 25 mg by mouth daily.     No current facility-administered medications for this visit.    Allergies  Allergen Reactions  . Shellfish Allergy Diarrhea and Nausea And Vomiting    Oysters only      REVIEW OF SYSTEMS:   [X]  denotes positive finding, [ ]  denotes negative finding Cardiac  Comments:  Chest pain or chest pressure:    Shortness of breath upon exertion:    Short of breath when lying flat:    Irregular heart rhythm:        Vascular    Pain in calf, thigh, or hip brought on by ambulation: x   Pain in feet at night that wakes you up from your sleep:  x   Blood clot in your veins:    Leg swelling:         Pulmonary    Oxygen at home:    Productive cough:     Wheezing:         Neurologic    Sudden weakness in arms or legs:     Sudden numbness in arms or legs:     Sudden onset of difficulty speaking or slurred speech:    Temporary loss of vision in one eye:     Problems with dizziness:         Gastrointestinal    Blood in stool:     Vomited blood:         Genitourinary    Burning when urinating:     Blood in urine:        Psychiatric    Major depression:          Hematologic    Bleeding problems:    Problems with blood clotting too easily:        Skin    Rashes or ulcers:        Constitutional    Fever or chills:  PHYSICAL EXAMINATION:  Vitals:   03/16/20 1458  BP: (!) 141/71  Pulse: (!) 56  Resp: 20  Temp: 98 F (36.7 C)  TempSrc: Temporal  SpO2: 99%  Weight: 180 lb 12.8 oz (82 kg)  Height: 6' (1.829 m)    General:  WDWN in NAD; vital signs documented above Gait: uses cane, no ataxia HENT: WNL, normocephalic Pulmonary: normal non-labored breathing , without Rales, rhonchi,  wheezing Cardiac: regular HR, without  Murmurs without carotid bruits Abdomen: soft, NT, no masses Skin: without rashes Vascular Exam/Pulses: 2+ radial and femoral pulses bilaterally Extremities: without ischemic changes, without Gangrene , without cellulitis; without open wounds;  Unable to palpate pedal pulses. Feet are warm with intact sensation and motor function Musculoskeletal: no muscle wasting or atrophy  Neurologic: A&O X 3;  No focal weakness or paresthesias are detected Psychiatric:  The pt has Normal affect.   Non-Invasive Vascular Imaging:   03/16/2020 Right: 50-74% stenosis noted in the superficial femoral artery. Distal SFA  stent is patent.   Left: 30-49% stenosis noted in the deep femoral artery. Total occlusion  noted in the distal superficial femoral artery.   ABI/TBIToday's ABIToday's TBIPrevious ABIPrevious TBI  +-------+-----------+-----------+------------+------------+  Right 0.92    0     0.54    0        +-------+-----------+-----------+------------+------------+  Left  0.45    0     0.53    0        +-------+-----------+-----------+------------+------------+  Right great toe pressure is 0.  Waveforms are biphasic Left great toe pressure is 0.  Waveforms are monophasic   ASSESSMENT/PLAN:: 70 y.o. male here for follow up for bilateral lower extremity claudication.   The patient symptoms have greatly improved on the right status post right SFA stent.  ABI improved to 0.92.  Attempts at recanalization of his left SFA unsuccessful.  Dissection plane encountered.  He understands the time interval needed for the dissection plane to heal prior to reattempts at recanalization.  The patient is scheduled for aortogram with left lower extremity runoff and intervention on April 07, 2020.  Continue Plavix, aspirin, statin and Eliquis.   Barbie Banner, PA-C [facility] [phone]  Clinic MD: Trula Slade

## 2020-03-16 NOTE — H&P (View-Only) (Signed)
Office Note     CC:  follow up Requesting Provider:  Charolette Forward, MD  HPI: [name] is a 70 y.o. (09/01/1950) male who presents for follow-up after aortogram with attempt at left lower extremity intervention due to intermittent claudication Dr. Trula Slade.  Findings included patency of the superficial femoral artery proximally however it occluded in the adductor canal with reconstitution just proximal to the joint space with two-vessel runoff via posterior tibial and peroneal arteries.  An attempt to perform subintimal recannulization was performed.  A dissection plane developed, the procedure was aborted after multiple attempts.  The patient had previously undergone right lower extremity intervention with SFA stent placement. February 18, 2020.  He states much improvement in right lower extremity claudication.  His claudication symptoms persist on the left and he has a degree of rest pain.  He is compliant with aspirin, statin and Plavix.  He is also on Eliquis twice daily. The patient states he is stopped smoking 2 weeks ago. He is not diabetic  Past Medical History:  Diagnosis Date  . Allergy   . Anemia   . Anxiety   . Arthritis   . Cataract   . Depression   . Hypertension   . Myocardial infarction [facility])     Past Surgical History:  Procedure Laterality Date  . ABDOMINAL AORTOGRAM W/LOWER EXTREMITY Bilateral 02/18/2020   Procedure: ABDOMINAL AORTOGRAM W/LOWER EXTREMITY;  Surgeon: Serafina Mitchell, MD;  Location: [place] [facility];  Service: Cardiovascular;  Laterality: Bilateral;  . ABDOMINAL AORTOGRAM W/LOWER EXTREMITY N/A 03/10/2020   Procedure: ABDOMINAL AORTOGRAM W/LOWER EXTREMITY;  Surgeon: Serafina Mitchell, MD;  Location: [place] [facility];  Service: Cardiovascular;  Laterality: N/A;  . CORONARY ARTERY BYPASS GRAFT    . EYE SURGERY    . FRACTURE SURGERY    . LEFT HEART CATHETERIZATION WITH CORONARY/GRAFT ANGIOGRAM N/A 07/09/2013   Procedure: LEFT HEART  CATHETERIZATION WITH Beatrix Fetters;  Surgeon: Clent Demark, MD;  Location: [facility] CATH LAB;  Service: Cardiovascular;  Laterality: N/A;  . PERIPHERAL VASCULAR INTERVENTION Right 02/18/2020   Procedure: PERIPHERAL VASCULAR INTERVENTION;  Surgeon: Serafina Mitchell, MD;  Location: [place] [facility];  Service: Cardiovascular;  Laterality: Right;  SFA    Social History   Socioeconomic History  . Marital status: Married    Spouse name: Not on file  . Number of children: Not on file  . Years of education: Not on file  . Highest education level: Not on file  Occupational History  . Not on file  Tobacco Use  . Smoking status: Current Every Day Smoker    Packs/day: 1.00    Types: Cigarettes  . Smokeless tobacco: Never Used  Vaping Use  . Vaping Use: Never used  Substance and Sexual Activity  . Alcohol use: Yes  . Drug use: No  . Sexual activity: Yes  Other Topics Concern  . Not on file  Social History Narrative  . Not on file   Social Determinants of Health   Financial Resource Strain: Not on file  Food Insecurity: Not on file  Transportation Needs: Not on file  Physical Activity: Not on file  Stress: Not on file  Social Connections: Not on file  Intimate Partner Violence: Not on file   Family History  Problem Relation Age of Onset  . Diabetes Mother   . Cancer Father   . Hypertension Father   . Stroke Father     Current Outpatient Medications  Medication Sig Dispense  Refill  . allopurinol (ZYLOPRIM) 300 MG tablet Take 300 mg by mouth daily.    Marland Kitchen amiodarone (PACERONE) 200 MG tablet Take 200 mg by mouth daily.    Marland Kitchen aspirin EC 81 MG tablet Take 81 mg by mouth daily.    Marland Kitchen atorvastatin (LIPITOR) 40 MG tablet Take 1 tablet (40 mg total) by mouth daily. (Patient taking differently: Take 40 mg by mouth every evening.) 90 tablet 3  . cilostazol (PLETAL) 100 MG tablet Take 1 tablet (100 mg total) by mouth 2 (two) times daily before a meal. (Patient taking differently:  Take 100 mg by mouth daily.) 60 tablet 11  . clopidogrel (PLAVIX) 75 MG tablet Take 1 tablet (75 mg total) by mouth daily. 30 tablet 11  . ELIQUIS 5 MG TABS tablet Take 5 mg by mouth 2 (two) times daily.    Marland Kitchen losartan (COZAAR) 25 MG tablet Take 25 mg by mouth daily.    . metoprolol succinate (TOPROL-XL) 100 MG 24 hr tablet Take 100 mg by mouth 2 (two) times daily. Take with or immediately following a meal.    . Multiple Vitamins-Minerals (MULTIVITAMIN WITH MINERALS) tablet Take 1 tablet by mouth 4 (four) times a week. One a day    . nicotine polacrilex (COMMIT) 4 MG lozenge Take 4 mg by mouth as needed for smoking cessation.    . nitroGLYCERIN (NITROSTAT) 0.4 MG SL tablet Place 0.4 mg under the tongue every 5 (five) minutes x 3 doses as needed for chest pain.    Marland Kitchen sertraline (ZOLOFT) 25 MG tablet Take 25 mg by mouth daily.     No current facility-administered medications for this visit.    Allergies  Allergen Reactions  . Shellfish Allergy Diarrhea and Nausea And Vomiting    Oysters only      REVIEW OF SYSTEMS:   [X]  denotes positive finding, [ ]  denotes negative finding Cardiac  Comments:  Chest pain or chest pressure:    Shortness of breath upon exertion:    Short of breath when lying flat:    Irregular heart rhythm:        Vascular    Pain in calf, thigh, or hip brought on by ambulation: x   Pain in feet at night that wakes you up from your sleep:  x   Blood clot in your veins:    Leg swelling:         Pulmonary    Oxygen at home:    Productive cough:     Wheezing:         Neurologic    Sudden weakness in arms or legs:     Sudden numbness in arms or legs:     Sudden onset of difficulty speaking or slurred speech:    Temporary loss of vision in one eye:     Problems with dizziness:         Gastrointestinal    Blood in stool:     Vomited blood:         Genitourinary    Burning when urinating:     Blood in urine:        Psychiatric    Major depression:          Hematologic    Bleeding problems:    Problems with blood clotting too easily:        Skin    Rashes or ulcers:        Constitutional    Fever or chills:  PHYSICAL EXAMINATION:  Vitals:   03/16/20 1458  BP: (!) 141/71  Pulse: (!) 56  Resp: 20  Temp: 98 F (36.7 C)  TempSrc: Temporal  SpO2: 99%  Weight: 180 lb 12.8 oz (82 kg)  Height: 6' (1.829 m)    General:  WDWN in NAD; vital signs documented above Gait: uses cane, no ataxia HENT: WNL, normocephalic Pulmonary: normal non-labored breathing , without Rales, rhonchi,  wheezing Cardiac: regular HR, without  Murmurs without carotid bruits Abdomen: soft, NT, no masses Skin: without rashes Vascular Exam/Pulses: 2+ radial and femoral pulses bilaterally Extremities: without ischemic changes, without Gangrene , without cellulitis; without open wounds;  Unable to palpate pedal pulses. Feet are warm with intact sensation and motor function Musculoskeletal: no muscle wasting or atrophy  Neurologic: A&O X 3;  No focal weakness or paresthesias are detected Psychiatric:  The pt has Normal affect.   Non-Invasive Vascular Imaging:   03/16/2020 Right: 50-74% stenosis noted in the superficial femoral artery. Distal SFA  stent is patent.   Left: 30-49% stenosis noted in the deep femoral artery. Total occlusion  noted in the distal superficial femoral artery.   ABI/TBIToday's ABIToday's TBIPrevious ABIPrevious TBI  +-------+-----------+-----------+------------+------------+  Right 0.92    0     0.54    0        +-------+-----------+-----------+------------+------------+  Left  0.45    0     0.53    0        +-------+-----------+-----------+------------+------------+  Right great toe pressure is 0.  Waveforms are biphasic Left great toe pressure is 0.  Waveforms are monophasic   ASSESSMENT/PLAN:: 70 y.o. male here for follow up for bilateral lower extremity claudication.   The patient symptoms have greatly improved on the right status post right SFA stent.  ABI improved to 0.92.  Attempts at recanalization of his left SFA unsuccessful.  Dissection plane encountered.  He understands the time interval needed for the dissection plane to heal prior to reattempts at recanalization.  The patient is scheduled for aortogram with left lower extremity runoff and intervention on April 07, 2020.  Continue Plavix, aspirin, statin and Eliquis.   Barbie Banner, PA-C [facility] [phone]  Clinic MD: Trula Slade

## 2020-04-06 ENCOUNTER — Other Ambulatory Visit (HOSPITAL_COMMUNITY)
Admission: RE | Admit: 2020-04-06 | Discharge: 2020-04-06 | Disposition: A | Discharge status: Home / Self Care | Payer: Medicare HMO | Source: Ambulatory Visit | Attending: Surgery | Admitting: Surgery

## 2020-04-06 DIAGNOSIS — Z20822 Contact with and (suspected) exposure to covid-19: Secondary | ICD-10-CM | POA: Diagnosis not present

## 2020-04-06 DIAGNOSIS — Z01812 Encounter for preprocedural laboratory examination: Secondary | ICD-10-CM | POA: Diagnosis not present

## 2020-04-07 ENCOUNTER — Ambulatory Visit (HOSPITAL_COMMUNITY)
Admission: RE | Admit: 2020-04-07 | Discharge: 2020-04-07 | Disposition: A | Discharge status: Home / Self Care | Payer: Medicare HMO | Attending: Surgery | Admitting: Surgery

## 2020-04-07 ENCOUNTER — Encounter (HOSPITAL_COMMUNITY)
Admission: RE | Disposition: A | Discharge status: Home / Self Care | Payer: Self-pay | Source: Home / Self Care | Attending: Surgery

## 2020-04-07 ENCOUNTER — Other Ambulatory Visit: Payer: Self-pay

## 2020-04-07 DIAGNOSIS — Z7902 Long term (current) use of antithrombotics/antiplatelets: Secondary | ICD-10-CM | POA: Diagnosis not present

## 2020-04-07 DIAGNOSIS — Z79899 Other long term (current) drug therapy: Secondary | ICD-10-CM | POA: Insufficient documentation

## 2020-04-07 DIAGNOSIS — Z7901 Long term (current) use of anticoagulants: Secondary | ICD-10-CM | POA: Insufficient documentation

## 2020-04-07 DIAGNOSIS — Z91013 Allergy to seafood: Secondary | ICD-10-CM | POA: Insufficient documentation

## 2020-04-07 DIAGNOSIS — F17210 Nicotine dependence, cigarettes, uncomplicated: Secondary | ICD-10-CM | POA: Insufficient documentation

## 2020-04-07 DIAGNOSIS — I70212 Atherosclerosis of native arteries of extremities with intermittent claudication, left leg: Secondary | ICD-10-CM | POA: Insufficient documentation

## 2020-04-07 DIAGNOSIS — Z7982 Long term (current) use of aspirin: Secondary | ICD-10-CM | POA: Diagnosis not present

## 2020-04-07 HISTORY — PX: ABDOMINAL AORTOGRAM W/LOWER EXTREMITY: CATH118223

## 2020-04-07 HISTORY — PX: PERIPHERAL VASCULAR BALLOON ANGIOPLASTY: CATH118281

## 2020-04-07 HISTORY — PX: LOWER EXTREMITY ANGIOGRAPHY: CATH118251

## 2020-04-07 LAB — POCT I-STAT, CHEM 8
BUN: 18 mg/dL (ref 8–23)
Calcium, Ion: 1.25 mmol/L (ref 1.15–1.40)
Chloride: 104 mmol/L (ref 98–111)
Creatinine, Ser: 1.1 mg/dL (ref 0.61–1.24)
Glucose, Bld: 91 mg/dL (ref 70–99)
HCT: 31 % — ABNORMAL LOW (ref 39.0–52.0)
Hemoglobin: 10.5 g/dL — ABNORMAL LOW (ref 13.0–17.0)
Potassium: 4.9 mmol/L (ref 3.5–5.1)
Sodium: 141 mmol/L (ref 135–145)
TCO2: 29 mmol/L (ref 22–32)

## 2020-04-07 LAB — SARS CORONAVIRUS 2 (TAT 6-24 HRS): SARS Coronavirus 2: NEGATIVE

## 2020-04-07 LAB — POCT ACTIVATED CLOTTING TIME
Activated Clotting Time: 249 seconds
Activated Clotting Time: 196 seconds
Activated Clotting Time: 172 seconds

## 2020-04-07 SURGERY — ABDOMINAL AORTOGRAM W/LOWER EXTREMITY
Anesthesia: LOCAL | Laterality: Left

## 2020-04-07 MED ORDER — HYDRALAZINE HCL 20 MG/ML IJ SOLN
INTRAMUSCULAR | Status: AC
  Filled 2020-04-07: qty 1

## 2020-04-07 MED ORDER — ACETAMINOPHEN 325 MG PO TABS: 650.0000 mg | ORAL_TABLET | ORAL | Status: DC | PRN

## 2020-04-07 MED ORDER — LIDOCAINE HCL (PF) 1 % IJ SOLN
INTRAMUSCULAR | Status: DC | PRN
  Administered 2020-04-07: 12 mL

## 2020-04-07 MED ORDER — HEPARIN (PORCINE) IN NACL 1000-0.9 UT/500ML-% IV SOLN
INTRAVENOUS | Status: AC
  Filled 2020-04-07: qty 1000

## 2020-04-07 MED ORDER — MORPHINE SULFATE (PF) 2 MG/ML IV SOLN
INTRAVENOUS | Status: AC
  Filled 2020-04-07: qty 1

## 2020-04-07 MED ORDER — MIDAZOLAM HCL 2 MG/2ML IJ SOLN
INTRAMUSCULAR | Status: DC | PRN
  Administered 2020-04-07: 2 mg via INTRAVENOUS

## 2020-04-07 MED ORDER — HEPARIN SODIUM (PORCINE) 1000 UNIT/ML IJ SOLN
INTRAMUSCULAR | Status: AC
  Filled 2020-04-07: qty 1

## 2020-04-07 MED ORDER — HYDRALAZINE HCL 20 MG/ML IJ SOLN
5.0000 mg | INTRAMUSCULAR | Status: DC | PRN
  Administered 2020-04-07: 5 mg via INTRAVENOUS

## 2020-04-07 MED ORDER — ONDANSETRON HCL 4 MG/2ML IJ SOLN: 4.0000 mg | Freq: Four times a day (QID) | INTRAMUSCULAR | Status: DC | PRN

## 2020-04-07 MED ORDER — SODIUM CHLORIDE 0.9% FLUSH: 3.0000 mL | INTRAVENOUS | Status: DC | PRN

## 2020-04-07 MED ORDER — FENTANYL CITRATE (PF) 100 MCG/2ML IJ SOLN
INTRAMUSCULAR | Status: DC | PRN
  Administered 2020-04-07: 50 ug via INTRAVENOUS

## 2020-04-07 MED ORDER — LIDOCAINE HCL (PF) 1 % IJ SOLN
INTRAMUSCULAR | Status: AC
  Filled 2020-04-07: qty 30

## 2020-04-07 MED ORDER — LABETALOL HCL 5 MG/ML IV SOLN: 10.0000 mg | INTRAVENOUS | Status: DC | PRN

## 2020-04-07 MED ORDER — MORPHINE SULFATE (PF) 2 MG/ML IV SOLN
2.0000 mg | INTRAVENOUS | Status: DC | PRN
  Administered 2020-04-07: 2 mg via INTRAVENOUS

## 2020-04-07 MED ORDER — SODIUM CHLORIDE 0.9 % WEIGHT BASED INFUSION
1.0000 mL/kg/h | INTRAVENOUS | Status: DC
  Administered 2020-04-07: 1 mL/kg/h via INTRAVENOUS

## 2020-04-07 MED ORDER — IODIXANOL 320 MG/ML IV SOLN
INTRAVENOUS | Status: DC | PRN
  Administered 2020-04-07: 95 mL

## 2020-04-07 MED ORDER — SODIUM CHLORIDE 0.9% FLUSH: 3.0000 mL | Freq: Two times a day (BID) | INTRAVENOUS | Status: DC

## 2020-04-07 MED ORDER — OXYCODONE HCL 5 MG PO TABS: 5.0000 mg | ORAL_TABLET | ORAL | Status: DC | PRN

## 2020-04-07 MED ORDER — FENTANYL CITRATE (PF) 100 MCG/2ML IJ SOLN
INTRAMUSCULAR | Status: AC
  Filled 2020-04-07: qty 2

## 2020-04-07 MED ORDER — MIDAZOLAM HCL 2 MG/2ML IJ SOLN
INTRAMUSCULAR | Status: AC
  Filled 2020-04-07: qty 2

## 2020-04-07 MED ORDER — HYDRALAZINE HCL 20 MG/ML IJ SOLN
INTRAMUSCULAR | Status: DC | PRN
  Administered 2020-04-07: 10 mg via INTRAVENOUS

## 2020-04-07 MED ORDER — HEPARIN (PORCINE) IN NACL 1000-0.9 UT/500ML-% IV SOLN
INTRAVENOUS | Status: DC | PRN
  Administered 2020-04-07 (×2): 500 mL

## 2020-04-07 MED ORDER — SODIUM CHLORIDE 0.9 % IV SOLN: 250.0000 mL | INTRAVENOUS | Status: DC | PRN

## 2020-04-07 MED ORDER — SODIUM CHLORIDE 0.9 % IV SOLN: INTRAVENOUS | Status: DC

## 2020-04-07 MED ORDER — HEPARIN SODIUM (PORCINE) 1000 UNIT/ML IJ SOLN
INTRAMUSCULAR | Status: DC | PRN
  Administered 2020-04-07: 9000 [IU] via INTRAVENOUS

## 2020-04-07 SURGICAL SUPPLY — 22 items
BALLN MUSTANG 4X100X135 (BALLOONS) ×2
BALLOON MUSTANG 4X100X135 (BALLOONS) IMPLANT
CATH OMNI FLUSH 5F 65CM (CATHETERS) ×1 IMPLANT
CATH QUICKCROSS SUPP .035X90CM (MICROCATHETER) ×1 IMPLANT
DEVICE CONTINUOUS FLUSH (MISCELLANEOUS) ×1 IMPLANT
DEVICE TORQUE H2O (MISCELLANEOUS) ×1 IMPLANT
GLIDEWIRE ADV .035X260CM (WIRE) ×1 IMPLANT
GUIDEWIRE ANGLED .035X150CM (WIRE) ×1 IMPLANT
KIT ENCORE 26 ADVANTAGE (KITS) ×1 IMPLANT
KIT MICROPUNCTURE NIT STIFF (SHEATH) ×1 IMPLANT
KIT PV (KITS) ×2 IMPLANT
SHEATH PINNACLE 5F 10CM (SHEATH) ×1 IMPLANT
SHEATH PINNACLE 6F 10CM (SHEATH) ×1 IMPLANT
SHEATH PINNACLE MP 6F 45CM (SHEATH) ×1 IMPLANT
SHEATH PINNACLE ST 6F 45CM (SHEATH) ×1 IMPLANT
SHEATH PROBE COVER 6X72 (BAG) ×1 IMPLANT
SHIELD RADPAD SCOOP 12X17 (MISCELLANEOUS) ×1 IMPLANT
SYR MEDRAD MARK V 150ML (SYRINGE) ×1 IMPLANT
TRANSDUCER W/STOPCOCK (MISCELLANEOUS) ×2 IMPLANT
TRAY PV CATH (CUSTOM PROCEDURE TRAY) ×2 IMPLANT
WIRE BENTSON .035X145CM (WIRE) ×1 IMPLANT
WIRE HI TORQ VERSACORE J 260CM (WIRE) ×1 IMPLANT

## 2020-04-07 NOTE — Progress Notes (Signed)
Site area: rt groin fa sheath Site Prior to Removal:  Level 0 Pressure Applied For: 20 minutes Manual:   yes Patient Status During Pull:  stable Post Pull Site:  Level 0 Post Pull Instructions Given:  yes Post Pull Pulses Present: rt pt dopplered Dressing Applied:  Gauze and tegaderm Bedrest begins @ 1130 Comments:

## 2020-04-07 NOTE — Interval H&P Note (Signed)
History and Physical Interval Note:  04/07/2020 7:27 AM  [name]  has presented today for surgery, with the diagnosis of claudication.  The various methods of treatment have been discussed with the patient and family. After consideration of risks, benefits and other options for treatment, the patient has consented to  Procedure(s): ABDOMINAL AORTOGRAM W/LOWER EXTREMITY (N/A) as a surgical intervention.  The patient's history has been reviewed, patient examined, no change in status, stable for surgery.  I have reviewed the patient's chart and labs.  Questions were answered to the patient's satisfaction.     Annamarie Major

## 2020-04-07 NOTE — Discharge Instructions (Signed)
Femoral Site Care  This sheet gives you information about how to care for yourself after your procedure. Your health care provider may also give you more specific instructions. If you have problems or questions, contact your health care provider. What can I expect after the procedure? After the procedure, it is common to have:  Bruising that usually fades within 1-2 weeks.  Tenderness at the site. Follow these instructions at home: Wound care  Follow instructions from your health care provider about how to take care of your insertion site. Make sure you: ? Wash your hands with soap and water before you change your bandage (dressing). If soap and water are not available, use hand sanitizer. ? Change your dressing as told by your health care provider. ? Leave stitches (sutures), skin glue, or adhesive strips in place. These skin closures may need to stay in place for 2 weeks or longer. If adhesive strip edges start to loosen and curl up, you may trim the loose edges. Do not remove adhesive strips completely unless your health care provider tells you to do that.  Do not take baths, swim, or use a hot tub until your health care provider approves.  You may shower 24-48 hours after the procedure or as told by your health care provider. ? Gently wash the site with plain soap and water. ? Pat the area dry with a clean towel. ? Do not rub the site. This may cause bleeding.  Do not apply powder or lotion to the site. Keep the site clean and dry.  Check your femoral site every day for signs of infection. Check for: ? Redness, swelling, or pain. ? Fluid or blood. ? Warmth. ? Pus or a bad smell. Activity  For the first 2-3 days after your procedure, or as long as directed: ? Avoid climbing stairs as much as possible. ? Do not squat.  Do not lift anything that is heavier than 10 lb (4.5 kg), or the limit that you are told, until your health care provider says that it is safe.  Rest as  directed. ? Avoid sitting for a long time without moving. Get up to take short walks every 1-2 hours.  Do not drive for 24 hours if you were given a medicine to help you relax (sedative). General instructions  Take over-the-counter and prescription medicines only as told by your health care provider.  Keep all follow-up visits as told by your health care provider. This is important. Contact a health care provider if you have:  A fever or chills.  You have redness, swelling, or pain around your insertion site. Get help right away if:  The catheter insertion area swells very fast.  You pass out.  You suddenly start to sweat or your skin gets clammy.  The catheter insertion area is bleeding, and the bleeding does not stop when you hold steady pressure on the area.  The area near or just beyond the catheter insertion site becomes pale, cool, tingly, or numb. These symptoms may represent a serious problem that is an emergency. Do not wait to see if the symptoms will go away. Get medical help right away. Call your local emergency services (911 in the [place] Do not drive yourself to the hospital. Summary  After the procedure, it is common to have bruising that usually fades within 1-2 weeks.  Check your femoral site every day for signs of infection.  Do not lift anything that is heavier than 10 lb (4.5 kg), or   the limit that you are told, until your health care provider says that it is safe. This information is not intended to replace advice given to you by your health care provider. Make sure you discuss any questions you have with your health care provider. Document Revised: 09/06/2019 Document Reviewed: 09/06/2019 Elsevier Patient Education  2021 Elsevier Inc.  

## 2020-04-07 NOTE — Op Note (Signed)
Patient name: [name] MRN: [id number] DOB: 09-Jan-1951 Sex: male  04/07/2020 Pre-operative Diagnosis: Leg claudication Post-operative diagnosis:  Same Surgeon:  Annamarie Major Procedure Performed:  1.  Ultrasound-guided access, right femoral artery  2.  Left leg runoff  3.  Angioplasty, left femoral-popliteal artery  4.  Conscious sedation, 45 minutes    Indications: The patient has previously undergone stenting of his right leg.  He came back for a left leg stenting however I could not cross the occlusion.  He is back today for second attempt.  Procedure:  The patient was identified in the holding area and taken to room 8.  The patient was then placed supine on the table and prepped and draped in the usual sterile fashion.  A time out was called.  Conscious sedation was administered with the use of IV fentanyl and Versed under continuous physician and nurse monitoring.  Heart rate, blood pressure, and oxygen saturation were continuously monitored.  Total sedation time was 45 minutes.  Ultrasound was used to evaluate the right common femoral artery.  It was patent .  A digital ultrasound image was acquired.  A micropuncture needle was used to access the right common femoral artery under ultrasound guidance.  An 018 wire was advanced without resistance and a micropuncture sheath was placed.  The 018 wire was removed and a benson wire was placed.  The micropuncture sheath was exchanged for a 5 french sheath.  An omniflush catheter was inserted and used to cross the aortic bifurcation.  Cath was placed in the left external iliac artery and left leg runoff was performed Findings:   Left Lower Extremity: Left common femoral profundofemoral artery are patent throughout the course.  The superficial femoral artery occludes at the adductor canal with reconstitution just proximal to the joint space.  The below-knee popliteal artery is patent.  The dominant runoff is the posterior  tibial  Intervention: After the above images were acquired the decision made to proceed with intervention.  A 6 French 45 cm sheath was advanced into the left superficial femoral artery.  The patient was fully heparinized.  I then used a 035 Glidewire and a quick cross catheter as well as a Glidewire advantage to try to cross the occlusion.  I was able to reenter behind the joint space however on imaging studies this appeared to be in a dissection plane.  I elected to perform balloon angioplasty of the subintimal tract with a 4 mm balloon to see if this would improve the appearance of the dissection.  This was done down to the joint space.  I then performed an additional arteriogram through the balloon catheter at the joint space.  This showed there was a persistent dissection going into the proximal below-knee popliteal artery.  I did not feel that stenting would be durable and I did not want to cross the joint space with a stent as it may take away surgical options in the future.  I performed an additional arteriogram from the groin which did show inline flow through the occluded popliteal artery.  No further intervention was performed.  The sheath was brought back to the right iliac system and exchanged out for short 6 French sheath.  The patient was taken the holding area for sheath pull.  Impression:  #1  Occluded left superficial femoral and popliteal artery.  I was able to cross the occlusion however I was unable to get reentry into the true lumen above the joint space.  I balloon the subintimal tract and created inline flow however there was persistent dissection going into the below-knee popliteal artery.  I did not feel that stenting would be durable and that further attempts at revascularization could compromise surgical options in the future.  Therefore, I elected to stop.  #2 the patient was brought back to the office in 2 to 3 weeks to monitor his symptoms, check vein mapping, and see what his  next option is going to be either continued observation or femoral below-knee popliteal bypass    V. Annamarie Major, M.D., [facility] [place] Office: [phone] Pager:  [phone]

## 2020-04-08 ENCOUNTER — Encounter (HOSPITAL_COMMUNITY): Payer: Self-pay | Admitting: Surgery

## 2020-04-23 ENCOUNTER — Other Ambulatory Visit: Payer: Self-pay

## 2020-04-23 DIAGNOSIS — I70213 Atherosclerosis of native arteries of extremities with intermittent claudication, bilateral legs: Secondary | ICD-10-CM

## 2020-04-23 DIAGNOSIS — K5901 Slow transit constipation: Secondary | ICD-10-CM | POA: Insufficient documentation

## 2020-04-23 DIAGNOSIS — Z860100 Personal history of colon polyps, unspecified: Secondary | ICD-10-CM | POA: Insufficient documentation

## 2020-04-23 DIAGNOSIS — Z1211 Encounter for screening for malignant neoplasm of colon: Secondary | ICD-10-CM | POA: Insufficient documentation

## 2020-05-11 ENCOUNTER — Encounter: Payer: Medicare HMO | Admitting: Surgery

## 2020-05-11 ENCOUNTER — Ambulatory Visit (HOSPITAL_COMMUNITY): Admit: 2020-05-11 | Payer: Medicare HMO

## 2020-06-08 ENCOUNTER — Ambulatory Visit (INDEPENDENT_AMBULATORY_CARE_PROVIDER_SITE_OTHER): Payer: Medicare HMO | Admitting: Surgery

## 2020-06-08 ENCOUNTER — Encounter: Payer: Self-pay | Admitting: Surgery

## 2020-06-08 ENCOUNTER — Ambulatory Visit (HOSPITAL_COMMUNITY)
Admission: RE | Admit: 2020-06-08 | Discharge: 2020-06-08 | Disposition: A | Discharge status: Home / Self Care | Payer: Medicare HMO | Source: Ambulatory Visit | Attending: Surgery | Admitting: Surgery

## 2020-06-08 ENCOUNTER — Other Ambulatory Visit: Payer: Self-pay

## 2020-06-08 VITALS — BP 200/98 | HR 72 | Temp 98.4°F | Resp 20 | Ht 72.0 in | Wt 175.0 lb

## 2020-06-08 DIAGNOSIS — I70213 Atherosclerosis of native arteries of extremities with intermittent claudication, bilateral legs: Secondary | ICD-10-CM | POA: Diagnosis not present

## 2020-06-08 NOTE — Progress Notes (Signed)
[facility] [place]  Patient name: [name] MRN: [id number] DOB: November 20, 1950 Sex: male   REASON FOR VISIT:    Follow up  HISOTRY OF PRESENT ILLNESS:    [name] is a 70 y.o. male who I met in September 2021 for evaluation of bilateral claudication.  The patient had been having progressively worse cramps in both calves with walking approximately half a block.  The right leg was more severe than the left.  We initially attempted to manage this nonoperatively, beginning with smoking cessation, and exercise program, cilostazol.  On 201 2022 he underwent angiography and was found to have a right superficial femoral artery occlusion which was successfully stented.  He was brought back for an attempted intervention on the left leg which was unsuccessful on 03/10/2020.  Then on 04/07/2020, I repeated the procedure.  This time I was able to cross the lesion however could not gain reentry.  He is back today to discuss surgical options.  He feels that his legs are better but he is still having claudication symptoms at approximately 200 yards.  He does not have open ulcers.  Patient has a history of cardiovascular disease. He is status post CABG at age 62. He has subsequently undergone PCI x3. He is on Eliquis for atrial fibrillation. He takes a statin for hypercholesterolemia. He is a current smoker.   PAST MEDICAL HISTORY:   Past Medical History:  Diagnosis Date  . Allergy   . Anemia   . Anxiety   . Arthritis   . Cataract   . Depression   . Hypertension   . Myocardial infarction [facility])      FAMILY HISTORY:   Family History  Problem Relation Age of Onset  . Diabetes Mother   . Cancer Father   . Hypertension Father   . Stroke Father     SOCIAL HISTORY:   Social History   Tobacco Use  . Smoking status: Current Every Day Smoker    Packs/day: 0.50    Types: Cigarettes  . Smokeless tobacco: Never Used  Substance  Use Topics  . Alcohol use: Yes     ALLERGIES:   Allergies  Allergen Reactions  . Shellfish Allergy Diarrhea and Nausea And Vomiting    Oysters only      CURRENT MEDICATIONS:   Current Outpatient Medications  Medication Sig Dispense Refill  . allopurinol (ZYLOPRIM) 300 MG tablet Take 300 mg by mouth daily.    Marland Kitchen amiodarone (PACERONE) 200 MG tablet Take 200 mg by mouth daily.    Marland Kitchen aspirin EC 81 MG tablet Take 81 mg by mouth daily.    Marland Kitchen atorvastatin (LIPITOR) 40 MG tablet Take 1 tablet (40 mg total) by mouth daily. (Patient taking differently: Take 40 mg by mouth every evening.) 90 tablet 3  . cilostazol (PLETAL) 100 MG tablet Take 1 tablet (100 mg total) by mouth 2 (two) times daily before a meal. (Patient taking differently: Take 100 mg by mouth daily.) 60 tablet 11  . clopidogrel (PLAVIX) 75 MG tablet Take 1 tablet (75 mg total) by mouth daily. 30 tablet 11  . ELIQUIS 5 MG TABS tablet Take 5 mg by mouth 2 (two) times daily.    Marland Kitchen losartan (COZAAR) 25 MG tablet Take 25 mg by mouth daily.    . metoprolol succinate (TOPROL-XL) 100 MG 24 hr tablet Take 100 mg by mouth 2 (two) times daily. Take with or immediately following a meal.    .  Multiple Vitamins-Minerals (MULTIVITAMIN WITH MINERALS) tablet Take 1 tablet by mouth 4 (four) times a week. One a day    . nicotine polacrilex (COMMIT) 4 MG lozenge Take 4 mg by mouth as needed for smoking cessation.    . nitroGLYCERIN (NITROSTAT) 0.4 MG SL tablet Place 0.4 mg under the tongue every 5 (five) minutes x 3 doses as needed for chest pain.     No current facility-administered medications for this visit.    REVIEW OF SYSTEMS:   _0  denotes positive finding, _1  denotes negative finding Cardiac  Comments:  Chest pain or chest pressure:    Shortness of breath upon exertion:    Short of breath when lying flat:    Irregular heart rhythm:        Vascular    Pain in calf, thigh, or hip brought on by ambulation: x   Pain in feet at night  that wakes you up from your sleep:     Blood clot in your veins:    Leg swelling:         Pulmonary    Oxygen at home:    Productive cough:     Wheezing:         Neurologic    Sudden weakness in arms or legs:     Sudden numbness in arms or legs:     Sudden onset of difficulty speaking or slurred speech:    Temporary loss of vision in one eye:     Problems with dizziness:         Gastrointestinal    Blood in stool:     Vomited blood:         Genitourinary    Burning when urinating:     Blood in urine:        Psychiatric    Major depression:         Hematologic    Bleeding problems:    Problems with blood clotting too easily:        Skin    Rashes or ulcers:        Constitutional    Fever or chills:      PHYSICAL EXAM:   Vitals:   06/08/20 1517  BP: (!) 200/98  Pulse: 72  Resp: 20  Temp: 98.4 F (36.9 C)  SpO2: 97%  Weight: 175 lb (79.4 kg)  Height: 6' (1.829 m)    GENERAL: The patient is a well-nourished male, in no acute distress. The vital signs are documented above. CARDIAC: There is a regular rate and rhythm.  VASCULAR: Nonpalpable pedal pulses PULMONARY: Non-labored respirations MUSCULOSKELETAL: There are no major deformities or cyanosis. NEUROLOGIC: No focal weakness or paresthesias are detected. SKIN: There are no ulcers or rashes noted. PSYCHIATRIC: The patient has a normal affect.  STUDIES:   I have reviewed the following vein map:  RT Diameter RT Findings     GSV      LT Diameter LT  Findings     (cm)                       (cm)           +---------------+-----------+----------------------+---------------+-------  ----+     0.31     AAGSV    Saphenofemoral     0.56                             Junction                    +---------------+-----------+----------------------+---------------+-------  ----+  0.22      AAGSV    Proximal thigh     0.38           +---------------+-----------+----------------------+---------------+-------  ----+     0.23     AAGSV     Mid thigh       0.30           +---------------+-----------+----------------------+---------------+-------  ----+     0.18     AAGSV     Distal thigh      0.34           +---------------+-----------+----------------------+---------------+-------  ----+     0.19     AAGSV       Knee        0.25           +---------------+-----------+----------------------+---------------+-------  ----+     0.11            Prox calf       0.27           +---------------+-----------+----------------------+---------------+-------  ----+                    Mid calf       0.25           +---------------+-----------+----------------------+---------------+-------  ----+                   Distal calf      0.15           +---------------+-----------+----------------------+---------------+-------  ----+   +----------------+-----------+---------------+----------------+-----------+   RT diameter (cm)RT Findings   SSV   LT Diameter (cm)LT  Findings  +----------------+-----------+---------------+----------------+-----------+      0.14         Popliteal fossa   0.15            +----------------+-----------+---------------+----------------+-----------+      0.16          Proximal calf    0.15            +----------------+-----------+---------------+----------------+-----------+      0.19           Mid calf     0.16            +----------------+-----------+---------------+----------------+-----------+                  Distal  calf    0.12            +----------------+-----------+---------------+----------------+-----------+   MEDICAL ISSUES:   I discussed with the patient that if we proceed with revascularization it would be a below-knee popliteal bypass graft with a marginal vein.  I feel that this operation should be reserved for rest pain or nonhealing wounds.  I have encouraged him to ambulate as much as possible and start a exercise program.  He will continue his current medication regimen.  He will return in 6 months for duplex and ABIs.  He knows to contact me should he develop a nonhealing wound.    Leia Alf, MD, FACS [facility] [phone] Pager [phone]

## 2020-06-19 DIAGNOSIS — Z20822 Contact with and (suspected) exposure to covid-19: Secondary | ICD-10-CM | POA: Diagnosis not present

## 2020-07-30 ENCOUNTER — Telehealth: Payer: Self-pay

## 2020-07-30 NOTE — Telephone Encounter (Signed)
Patient's spouse called in to schedule a new patient appt. New patient packet sent by mail.

## 2020-08-16 ENCOUNTER — Emergency Department (HOSPITAL_COMMUNITY): Payer: Medicare HMO

## 2020-08-16 ENCOUNTER — Inpatient Hospital Stay (HOSPITAL_COMMUNITY)
Admission: EM | Admit: 2020-08-16 | Discharge: 2020-08-20 | DRG: 287 | Disposition: A | Discharge status: Home / Self Care | Payer: Medicare HMO | Attending: Cardiology | Admitting: Cardiology

## 2020-08-16 ENCOUNTER — Encounter (HOSPITAL_COMMUNITY): Payer: Self-pay

## 2020-08-16 ENCOUNTER — Other Ambulatory Visit: Payer: Self-pay

## 2020-08-16 ENCOUNTER — Ambulatory Visit (HOSPITAL_COMMUNITY)
Admission: EM | Admit: 2020-08-16 | Discharge: 2020-08-16 | Disposition: A | Discharge status: Home / Self Care | Payer: Medicare HMO | Attending: Internal Medicine | Admitting: Internal Medicine

## 2020-08-16 DIAGNOSIS — K319 Disease of stomach and duodenum, unspecified: Secondary | ICD-10-CM | POA: Diagnosis present

## 2020-08-16 DIAGNOSIS — M109 Gout, unspecified: Secondary | ICD-10-CM | POA: Diagnosis present

## 2020-08-16 DIAGNOSIS — I739 Peripheral vascular disease, unspecified: Secondary | ICD-10-CM | POA: Diagnosis not present

## 2020-08-16 DIAGNOSIS — N182 Chronic kidney disease, stage 2 (mild): Secondary | ICD-10-CM | POA: Diagnosis present

## 2020-08-16 DIAGNOSIS — F1010 Alcohol abuse, uncomplicated: Secondary | ICD-10-CM | POA: Diagnosis present

## 2020-08-16 DIAGNOSIS — K259 Gastric ulcer, unspecified as acute or chronic, without hemorrhage or perforation: Secondary | ICD-10-CM | POA: Diagnosis not present

## 2020-08-16 DIAGNOSIS — Z7982 Long term (current) use of aspirin: Secondary | ICD-10-CM

## 2020-08-16 DIAGNOSIS — Z823 Family history of stroke: Secondary | ICD-10-CM | POA: Diagnosis not present

## 2020-08-16 DIAGNOSIS — Z809 Family history of malignant neoplasm, unspecified: Secondary | ICD-10-CM | POA: Diagnosis not present

## 2020-08-16 DIAGNOSIS — K449 Diaphragmatic hernia without obstruction or gangrene: Secondary | ICD-10-CM | POA: Diagnosis not present

## 2020-08-16 DIAGNOSIS — K922 Gastrointestinal hemorrhage, unspecified: Secondary | ICD-10-CM | POA: Diagnosis not present

## 2020-08-16 DIAGNOSIS — I480 Paroxysmal atrial fibrillation: Secondary | ICD-10-CM | POA: Diagnosis not present

## 2020-08-16 DIAGNOSIS — I129 Hypertensive chronic kidney disease with stage 1 through stage 4 chronic kidney disease, or unspecified chronic kidney disease: Secondary | ICD-10-CM | POA: Diagnosis present

## 2020-08-16 DIAGNOSIS — Z7901 Long term (current) use of anticoagulants: Secondary | ICD-10-CM

## 2020-08-16 DIAGNOSIS — I10 Essential (primary) hypertension: Secondary | ICD-10-CM | POA: Diagnosis not present

## 2020-08-16 DIAGNOSIS — J90 Pleural effusion, not elsewhere classified: Secondary | ICD-10-CM | POA: Diagnosis not present

## 2020-08-16 DIAGNOSIS — K3189 Other diseases of stomach and duodenum: Secondary | ICD-10-CM | POA: Diagnosis not present

## 2020-08-16 DIAGNOSIS — Z951 Presence of aortocoronary bypass graft: Secondary | ICD-10-CM | POA: Diagnosis not present

## 2020-08-16 DIAGNOSIS — I252 Old myocardial infarction: Secondary | ICD-10-CM | POA: Diagnosis not present

## 2020-08-16 DIAGNOSIS — I4891 Unspecified atrial fibrillation: Secondary | ICD-10-CM | POA: Diagnosis present

## 2020-08-16 DIAGNOSIS — R9431 Abnormal electrocardiogram [ECG] [EKG]: Secondary | ICD-10-CM

## 2020-08-16 DIAGNOSIS — I249 Acute ischemic heart disease, unspecified: Secondary | ICD-10-CM | POA: Diagnosis not present

## 2020-08-16 DIAGNOSIS — R079 Chest pain, unspecified: Secondary | ICD-10-CM | POA: Diagnosis not present

## 2020-08-16 DIAGNOSIS — T462X5A Adverse effect of other antidysrhythmic drugs, initial encounter: Secondary | ICD-10-CM | POA: Diagnosis not present

## 2020-08-16 DIAGNOSIS — D509 Iron deficiency anemia, unspecified: Secondary | ICD-10-CM | POA: Diagnosis present

## 2020-08-16 DIAGNOSIS — I25118 Atherosclerotic heart disease of native coronary artery with other forms of angina pectoris: Principal | ICD-10-CM | POA: Diagnosis present

## 2020-08-16 DIAGNOSIS — D649 Anemia, unspecified: Secondary | ICD-10-CM | POA: Diagnosis not present

## 2020-08-16 DIAGNOSIS — Z20822 Contact with and (suspected) exposure to covid-19: Secondary | ICD-10-CM | POA: Diagnosis present

## 2020-08-16 DIAGNOSIS — I255 Ischemic cardiomyopathy: Secondary | ICD-10-CM | POA: Diagnosis present

## 2020-08-16 DIAGNOSIS — I5020 Unspecified systolic (congestive) heart failure: Secondary | ICD-10-CM | POA: Diagnosis not present

## 2020-08-16 DIAGNOSIS — M1990 Unspecified osteoarthritis, unspecified site: Secondary | ICD-10-CM | POA: Diagnosis present

## 2020-08-16 DIAGNOSIS — E875 Hyperkalemia: Secondary | ICD-10-CM | POA: Diagnosis present

## 2020-08-16 DIAGNOSIS — F17210 Nicotine dependence, cigarettes, uncomplicated: Secondary | ICD-10-CM | POA: Diagnosis not present

## 2020-08-16 DIAGNOSIS — Z833 Family history of diabetes mellitus: Secondary | ICD-10-CM | POA: Diagnosis not present

## 2020-08-16 DIAGNOSIS — I440 Atrioventricular block, first degree: Secondary | ICD-10-CM | POA: Diagnosis not present

## 2020-08-16 DIAGNOSIS — Z955 Presence of coronary angioplasty implant and graft: Secondary | ICD-10-CM

## 2020-08-16 DIAGNOSIS — Z8249 Family history of ischemic heart disease and other diseases of the circulatory system: Secondary | ICD-10-CM

## 2020-08-16 DIAGNOSIS — I2510 Atherosclerotic heart disease of native coronary artery without angina pectoris: Secondary | ICD-10-CM | POA: Diagnosis not present

## 2020-08-16 DIAGNOSIS — Z79899 Other long term (current) drug therapy: Secondary | ICD-10-CM

## 2020-08-16 DIAGNOSIS — I517 Cardiomegaly: Secondary | ICD-10-CM | POA: Diagnosis not present

## 2020-08-16 DIAGNOSIS — Z751 Person awaiting admission to adequate facility elsewhere: Secondary | ICD-10-CM

## 2020-08-16 DIAGNOSIS — R0789 Other chest pain: Secondary | ICD-10-CM | POA: Diagnosis not present

## 2020-08-16 DIAGNOSIS — R1084 Generalized abdominal pain: Secondary | ICD-10-CM | POA: Diagnosis not present

## 2020-08-16 DIAGNOSIS — R1031 Right lower quadrant pain: Secondary | ICD-10-CM | POA: Diagnosis not present

## 2020-08-16 DIAGNOSIS — E785 Hyperlipidemia, unspecified: Secondary | ICD-10-CM | POA: Diagnosis not present

## 2020-08-16 DIAGNOSIS — Z91013 Allergy to seafood: Secondary | ICD-10-CM

## 2020-08-16 DIAGNOSIS — Z7902 Long term (current) use of antithrombotics/antiplatelets: Secondary | ICD-10-CM

## 2020-08-16 DIAGNOSIS — R001 Bradycardia, unspecified: Secondary | ICD-10-CM | POA: Diagnosis not present

## 2020-08-16 LAB — COMPREHENSIVE METABOLIC PANEL
ALT: 30 U/L (ref 0–44)
AST: 69 U/L — ABNORMAL HIGH (ref 15–41)
Albumin: 3.3 g/dL — ABNORMAL LOW (ref 3.5–5.0)
Alkaline Phosphatase: 88 U/L (ref 38–126)
Anion gap: 9 (ref 5–15)
BUN: 20 mg/dL (ref 8–23)
CO2: 24 mmol/L (ref 22–32)
Calcium: 9.6 mg/dL (ref 8.9–10.3)
Chloride: 101 mmol/L (ref 98–111)
Creatinine, Ser: 1.45 mg/dL — ABNORMAL HIGH (ref 0.61–1.24)
GFR, Estimated: 52 mL/min — ABNORMAL LOW (ref >60)
Glucose, Bld: 106 mg/dL — ABNORMAL HIGH (ref 70–99)
Potassium: 7.1 mmol/L (ref 3.5–5.1)
Sodium: 134 mmol/L — ABNORMAL LOW (ref 135–145)
Total Bilirubin: 0.8 mg/dL (ref 0.3–1.2)
Total Protein: 8.1 g/dL (ref 6.5–8.1)

## 2020-08-16 LAB — HEPARIN LEVEL (UNFRACTIONATED)
Heparin Unfractionated: >1.1 IU/mL — ABNORMAL HIGH (ref 0.30–0.70)
Heparin Unfractionated: >1.1 IU/mL — ABNORMAL HIGH (ref 0.30–0.70)

## 2020-08-16 LAB — CBC
HCT: 31.6 % — ABNORMAL LOW (ref 39.0–52.0)
Hemoglobin: 9.2 g/dL — ABNORMAL LOW (ref 13.0–17.0)
MCH: 21.1 pg — ABNORMAL LOW (ref 26.0–34.0)
MCHC: 29.1 g/dL — ABNORMAL LOW (ref 30.0–36.0)
MCV: 72.5 fL — ABNORMAL LOW (ref 80.0–100.0)
Platelets: 397 10*3/uL (ref 150–400)
RBC: 4.36 MIL/uL (ref 4.22–5.81)
RDW: 23.3 % — ABNORMAL HIGH (ref 11.5–15.5)
WBC: 9.5 10*3/uL (ref 4.0–10.5)
nRBC: 0.4 % — ABNORMAL HIGH (ref 0.0–0.2)

## 2020-08-16 LAB — TROPONIN I (HIGH SENSITIVITY)
Troponin I (High Sensitivity): 338 ng/L (ref <18)
Troponin I (High Sensitivity): 323 ng/L (ref <18)

## 2020-08-16 LAB — PROTIME-INR
INR: 1.4 — ABNORMAL HIGH (ref 0.8–1.2)
Prothrombin Time: 17.1 seconds — ABNORMAL HIGH (ref 11.4–15.2)

## 2020-08-16 LAB — APTT
aPTT: 34 seconds (ref 24–36)
aPTT: 47 seconds — ABNORMAL HIGH (ref 24–36)

## 2020-08-16 LAB — POTASSIUM: Potassium: 5.1 mmol/L (ref 3.5–5.1)

## 2020-08-16 MED ORDER — NITROGLYCERIN IN D5W 200-5 MCG/ML-% IV SOLN
10.0000 ug/min | INTRAVENOUS | Status: DC
  Administered 2020-08-16: 10 ug/min via INTRAVENOUS
  Filled 2020-08-16: qty 250

## 2020-08-16 MED ORDER — ONDANSETRON HCL 4 MG/2ML IJ SOLN: 4.0000 mg | Freq: Four times a day (QID) | INTRAMUSCULAR | Status: DC | PRN

## 2020-08-16 MED ORDER — NITROGLYCERIN IN D5W 200-5 MCG/ML-% IV SOLN
0.0000 ug/min | INTRAVENOUS | Status: DC
  Administered 2020-08-16: 45 ug/min via INTRAVENOUS
  Administered 2020-08-16: 5 ug/min via INTRAVENOUS
  Filled 2020-08-16: qty 250

## 2020-08-16 MED ORDER — IOHEXOL 300 MG/ML  SOLN
100.0000 mL | Freq: Once | INTRAMUSCULAR | Status: AC | PRN
  Administered 2020-08-16: 100 mL via INTRAVENOUS

## 2020-08-16 MED ORDER — SODIUM CHLORIDE 0.9 % IV BOLUS
500.0000 mL | Freq: Once | INTRAVENOUS | Status: AC
  Administered 2020-08-16: 500 mL via INTRAVENOUS

## 2020-08-16 MED ORDER — ASPIRIN 81 MG PO CHEW
324.0000 mg | CHEWABLE_TABLET | ORAL | Status: AC
  Administered 2020-08-16: 324 mg via ORAL
  Filled 2020-08-16: qty 4

## 2020-08-16 MED ORDER — ASPIRIN EC 81 MG PO TBEC
81.0000 mg | DELAYED_RELEASE_TABLET | Freq: Every day | ORAL | Status: DC
  Administered 2020-08-17 – 2020-08-19 (×2): 81 mg via ORAL
  Filled 2020-08-16 (×2): qty 1

## 2020-08-16 MED ORDER — PANTOPRAZOLE SODIUM 40 MG PO TBEC
40.0000 mg | DELAYED_RELEASE_TABLET | Freq: Every day | ORAL | Status: DC
  Administered 2020-08-17 – 2020-08-20 (×3): 40 mg via ORAL
  Filled 2020-08-16 (×3): qty 1

## 2020-08-16 MED ORDER — ACETAMINOPHEN 325 MG PO TABS: 650.0000 mg | ORAL_TABLET | ORAL | Status: DC | PRN

## 2020-08-16 MED ORDER — ATORVASTATIN CALCIUM 80 MG PO TABS
80.0000 mg | ORAL_TABLET | Freq: Every day | ORAL | Status: DC
  Administered 2020-08-16 – 2020-08-20 (×4): 80 mg via ORAL
  Filled 2020-08-16 (×2): qty 1
  Filled 2020-08-16: qty 2
  Filled 2020-08-16 (×2): qty 1

## 2020-08-16 MED ORDER — ASPIRIN 325 MG PO TABS
325.0000 mg | ORAL_TABLET | Freq: Every day | ORAL | Status: DC
  Administered 2020-08-16: 325 mg via ORAL
  Filled 2020-08-16: qty 1

## 2020-08-16 MED ORDER — AMLODIPINE BESYLATE 2.5 MG PO TABS
2.5000 mg | ORAL_TABLET | Freq: Every day | ORAL | Status: DC
  Administered 2020-08-16 – 2020-08-20 (×4): 2.5 mg via ORAL
  Filled 2020-08-16 (×4): qty 1

## 2020-08-16 MED ORDER — ASPIRIN 300 MG RE SUPP: 300.0000 mg | RECTAL | Status: AC

## 2020-08-16 MED ORDER — SODIUM CHLORIDE 0.9 % IV SOLN: INTRAVENOUS | Status: DC

## 2020-08-16 MED ORDER — NITROGLYCERIN 0.4 MG SL SUBL: 0.4000 mg | SUBLINGUAL_TABLET | SUBLINGUAL | Status: DC | PRN

## 2020-08-16 MED ORDER — HEPARIN (PORCINE) 25000 UT/250ML-% IV SOLN
1200.0000 [IU]/h | INTRAVENOUS | Status: DC
  Administered 2020-08-16: 1000 [IU]/h via INTRAVENOUS
  Administered 2020-08-17: 1200 [IU]/h via INTRAVENOUS
  Filled 2020-08-16 (×2): qty 250

## 2020-08-16 MED ORDER — LOSARTAN POTASSIUM 50 MG PO TABS
50.0000 mg | ORAL_TABLET | Freq: Every day | ORAL | Status: DC
  Administered 2020-08-16 – 2020-08-17 (×2): 50 mg via ORAL
  Filled 2020-08-16 (×2): qty 1

## 2020-08-16 MED ORDER — METOPROLOL TARTRATE 12.5 MG HALF TABLET
12.5000 mg | ORAL_TABLET | Freq: Two times a day (BID) | ORAL | Status: DC
  Administered 2020-08-16: 12.5 mg via ORAL
  Filled 2020-08-16: qty 1

## 2020-08-16 NOTE — Progress Notes (Signed)
[place] for Heparin Indication: chest pain/ACS  Allergies  Allergen Reactions   Shellfish Allergy Diarrhea and Nausea And Vomiting    Oysters only     Patient Measurements: Height: 6' (182.9 cm) Weight: 77.1 kg (170 lb) IBW/kg (Calculated) : 77.6 Heparin Dosing Weight: 77.1 kg  Vital Signs: Temp: 99 F (37.2 C) (07/31 1748) Temp Source: Oral (07/31 1748) BP: 163/86 (07/31 2300) Pulse Rate: 61 (07/31 2300)  Labs: Recent Labs    08/16/20 1205 08/16/20 1405 08/16/20 1613 08/16/20 2310  HGB 9.2*  --   --   --   HCT 31.6*  --   --   --   PLT 397  --   --   --   APTT  --   --  34 47*  LABPROT  --   --  17.1*  --   INR  --   --  1.4*  --   HEPARINUNFRC  --   --  >1.10* >1.10*  CREATININE 1.45*  --   --   --   TROPONINIHS 338* 323*  --   --      Estimated Creatinine Clearance: 51 mL/min (A) (by C-G formula based on SCr of 1.45 mg/dL (H)).   Assessment: 70 y.o. male admitted with chest pain, H/O Afib and Eliquis on hold, for heparin  Goal of Therapy:  Heparin level 0.3-0.7 units/ml aPTT 66-102 seconds Monitor platelets by anticoagulation protocol: Yes   Plan:  Increase Heparin 1200 units/hr Check heparin level in 8 hours.  Phillis Knack, PharmD, BCPS

## 2020-08-16 NOTE — ED Provider Notes (Addendum)
[facility] EMERGENCY DEPARTMENT Provider Note   CSN: [id number] Arrival date & time: 08/16/20  1141     History Chief Complaint  Patient presents with   Chest Pain    [name] is a 70 y.o. male.  HPI  70 year old male history of stents and CABG, hypertension, hyperlipidemia, peripheral vascular disease, smoker presents today from urgent care with report of abnormal EKG with prolonged QTC and nonspecific ST depression.  Patient reports that he has had some right-sided abdominal pain over the past 3 weeks.  It is worse with walking.  He denies any nausea, vomiting, diarrhea.  He denies any fever or chills.  He endorses that he has chest pain on a regular basis.  He states his pain is currently 2 out of 10.  He states he did have an episode that was worse yesterday.  He is unable to tell me specifically how long it lasted, states that it is different that was different from usual.  He reports that he cannot tell me the last time he had no chest pain.  He denies any dyspnea, cough.  Past Medical History:  Diagnosis Date   Allergy    Anemia    Anxiety    Arthritis    Cataract    Depression    Hypertension    Myocardial infarction [facility])     Patient Active Problem List   Diagnosis Date Noted   Colon cancer screening 04/23/2020   Personal history of colonic polyps 04/23/2020   Slow transit constipation 04/23/2020   Onychomycosis 12/13/2016   Alcohol dependence, daily use ([place]) 12/13/2016   Uncontrolled hypertension 12/13/2016   Ingrown toenail 12/13/2016   Hyperlipidemia 08/18/2014   Gout 08/18/2014   Tobacco use disorder 08/18/2014   History of MI (myocardial infarction) 08/18/2014    Past Surgical History:  Procedure Laterality Date   ABDOMINAL AORTOGRAM W/LOWER EXTREMITY Bilateral 02/18/2020   Procedure: ABDOMINAL AORTOGRAM W/LOWER EXTREMITY;  Surgeon: Serafina Mitchell, MD;  Location: [place] CV LAB;  Service: Cardiovascular;  Laterality:  Bilateral;   ABDOMINAL AORTOGRAM W/LOWER EXTREMITY N/A 03/10/2020   Procedure: ABDOMINAL AORTOGRAM W/LOWER EXTREMITY;  Surgeon: Serafina Mitchell, MD;  Location: [place] CV LAB;  Service: Cardiovascular;  Laterality: N/A;   ABDOMINAL AORTOGRAM W/LOWER EXTREMITY Left 04/07/2020   Procedure: ABDOMINAL AORTOGRAM W/LOWER EXTREMITY;  Surgeon: Serafina Mitchell, MD;  Location: [place] CV LAB;  Service: Cardiovascular;  Laterality: Left;   CORONARY ARTERY BYPASS GRAFT     EYE SURGERY     FRACTURE SURGERY     LEFT HEART CATHETERIZATION WITH CORONARY/GRAFT ANGIOGRAM N/A 07/09/2013   Procedure: LEFT HEART CATHETERIZATION WITH CORONARY/GRAFT ANGIOGRAM;  Surgeon: Clent Demark, MD;  Location: [place] CATH LAB;  Service: Cardiovascular;  Laterality: N/A;   LOWER EXTREMITY ANGIOGRAPHY Left 04/07/2020   Procedure: Lower Extremity Angiography;  Surgeon: Serafina Mitchell, MD;  Location: [place] CV LAB;  Service: Cardiovascular;  Laterality: Left;   PERIPHERAL VASCULAR BALLOON ANGIOPLASTY Left 04/07/2020   Procedure: PERIPHERAL VASCULAR BALLOON ANGIOPLASTY;  Surgeon: Serafina Mitchell, MD;  Location: [place] CV LAB;  Service: Cardiovascular;  Laterality: Left;  sfa   PERIPHERAL VASCULAR INTERVENTION Right 02/18/2020   Procedure: PERIPHERAL VASCULAR INTERVENTION;  Surgeon: Serafina Mitchell, MD;  Location: [place] CV LAB;  Service: Cardiovascular;  Laterality: Right;  SFA       Family History  Problem Relation Age of Onset   Diabetes Mother    Cancer Father  Hypertension Father    Stroke Father     Social History   Tobacco Use   Smoking status: Every Day    Packs/day: 0.50    Types: Cigarettes   Smokeless tobacco: Never  Vaping Use   Vaping Use: Never used  Substance Use Topics   Alcohol use: Yes   Drug use: No    Home Medications Prior to Admission medications   Medication Sig Start Date End Date Taking? Authorizing Provider  allopurinol (ZYLOPRIM) 300 MG tablet Take 300 mg by  mouth daily.    [provider]  amiodarone (PACERONE) 200 MG tablet Take 200 mg by mouth daily. 02/07/20   [provider]  aspirin EC 81 MG tablet Take 81 mg by mouth daily.    [provider]  atorvastatin (LIPITOR) 40 MG tablet Take 1 tablet (40 mg total) by mouth daily. Patient taking differently: Take 40 mg by mouth every evening. 08/18/14   Copland, Gay Filler, MD  cilostazol (PLETAL) 100 MG tablet Take 1 tablet (100 mg total) by mouth 2 (two) times daily before a meal. Patient taking differently: Take 100 mg by mouth daily. 09/30/19   Serafina Mitchell, MD  clopidogrel (PLAVIX) 75 MG tablet Take 1 tablet (75 mg total) by mouth daily. 02/18/20   Serafina Mitchell, MD  ELIQUIS 5 MG TABS tablet Take 5 mg by mouth 2 (two) times daily. 05/30/19   [provider]  losartan (COZAAR) 25 MG tablet Take 25 mg by mouth daily.    [provider]  metoprolol succinate (TOPROL-XL) 100 MG 24 hr tablet Take 100 mg by mouth 2 (two) times daily. Take with or immediately following a meal.    [provider]  Multiple Vitamins-Minerals (MULTIVITAMIN WITH MINERALS) tablet Take 1 tablet by mouth 4 (four) times a week. One a day    [provider]  nicotine polacrilex (COMMIT) 4 MG lozenge Take 4 mg by mouth as needed for smoking cessation.    [provider]  nitroGLYCERIN (NITROSTAT) 0.4 MG SL tablet Place 0.4 mg under the tongue every 5 (five) minutes x 3 doses as needed for chest pain. 12/10/19   [provider]    Allergies    Shellfish allergy  Review of Systems   Review of Systems  All other systems reviewed and are negative.  Physical Exam Updated Vital Signs BP (!) 193/89   Pulse (!) 52   Temp 98 F (36.7 C) (Oral)   Resp 16   Ht 1.829 m (6')   Wt 77.1 kg   SpO2 100%   BMI 23.06 kg/m   Physical Exam Vitals and nursing note reviewed.  Constitutional:      Appearance: He is well-developed.  HENT:     Head:  Normocephalic.  Eyes:     Pupils: Pupils are equal, round, and reactive to light.  Cardiovascular:     Rate and Rhythm: Regular rhythm. Bradycardia present.     Heart sounds: Normal heart sounds.  Pulmonary:     Effort: Pulmonary effort is normal.     Breath sounds: Normal breath sounds.  Abdominal:     Palpations: Abdomen is soft.     Tenderness: There is abdominal tenderness.     Comments: Mild diffuse  Musculoskeletal:        General: Normal range of motion.     Cervical back: Normal range of motion.  Skin:    General: Skin is warm and dry.  Capillary Refill: Capillary refill takes less than 2 seconds.  Neurological:     General: No focal deficit present.     Mental Status: He is alert.    ED Results / Procedures / Treatments   Labs (all labs ordered are listed, but only abnormal results are displayed) Labs Reviewed  CBC - Abnormal; Notable for the following components:      Result Value   Hemoglobin 9.2 (*)    HCT 31.6 (*)    MCV 72.5 (*)    MCH 21.1 (*)    MCHC 29.1 (*)    RDW 23.3 (*)    nRBC 0.4 (*)    All other components within normal limits  COMPREHENSIVE METABOLIC PANEL - Abnormal; Notable for the following components:   Sodium 134 (*)    Potassium 7.1 (*)    Glucose, Bld 106 (*)    Creatinine, Ser 1.45 (*)    Albumin 3.3 (*)    AST 69 (*)    GFR, Estimated 52 (*)    All other components within normal limits  TROPONIN I (HIGH SENSITIVITY) - Abnormal; Notable for the following components:   Troponin I (High Sensitivity) 338 (*)    All other components within normal limits  POTASSIUM  TROPONIN I (HIGH SENSITIVITY)    EKG None ED ECG REPORT   Date: 08/16/2020  Rate: 53  Rhythm: sinus bradycardia  QRS Axis: normal  Intervals: QT prolonged  ST/T Wave abnormalities: ST depressions diffusely  Conduction Disutrbances:none  Narrative Interpretation:   Old EKG Reviewed: changes noted  I have personally reviewed the EKG tracing and agree with the  computerized printout as noted.   Radiology CT ABDOMEN PELVIS W CONTRAST  Result Date: 08/16/2020 CLINICAL DATA:  Right lower quadrant pain EXAM: CT ABDOMEN AND PELVIS WITH CONTRAST TECHNIQUE: Multidetector CT imaging of the abdomen and pelvis was performed using the standard protocol following bolus administration of intravenous contrast. CONTRAST:  160m OMNIPAQUE IOHEXOL 300 MG/ML  SOLN COMPARISON:  CT abdomen 05/15/2008 FINDINGS: Lower chest: Bilateral small pleural effusions and adjacent linear opacities which could reflect atelectasis. Hepatobiliary: There is a cyst in the left hepatic lobe measuring 3.3 cm an additional scattered tiny hypodensities are too small to fully characterize but likely represent small cysts. Gallbladder surgically absent. No new intra or extrahepatic biliary duct dilation. Pancreas: Unremarkable. No pancreatic ductal dilatation or surrounding inflammatory changes. Spleen: Normal in size without focal abnormality. Adrenals/Urinary Tract: Adrenal glands are unremarkable. Kidneys are normal, without renal calculi, focal lesion, or hydronephrosis. Bladder appears slightly thick-walled but also under distended. Stomach/Bowel: Stomach is within normal limits. Appendix appears normal. No evidence of bowel wall thickening, distention, or inflammatory changes. Vascular/Lymphatic: Severe aortic atherosclerosis. No enlarged abdominal or pelvic lymph nodes. Reproductive: Prostate is unremarkable. Other: No abdominopelvic ascites. Musculoskeletal: Severe degenerative disc disease at L4-5. IMPRESSION: 1. No acute intra-abdominal pathology to explain the patient's right lower quadrant pain. Normal appendix. 2. Urinary bladder appears slightly thick-walled but also under distended. Recommend correlation with clinical history and possible urinalysis. 3. Bilateral small pleural effusions and adjacent linear opacities which could reflect atelectasis. 4. Severe aortic atherosclerosis.  No  aneurysm. Aortic Atherosclerosis (ICD10-I70.0). Electronically Signed   By: NAudie PintoM.D.   On: 08/16/2020 14:28   DG Chest Port 1 View  Result Date: 08/16/2020 CLINICAL DATA:  Chest pain EXAM: PORTABLE CHEST 1 VIEW COMPARISON:  09/21/2012 FINDINGS: Cardiomegaly, vascular congestion. Perihilar opacities likely reflect edema. Small bilateral effusions. No acute bony abnormality. Prior CABG.  IMPRESSION: Cardiomegaly with vascular congestion and perihilar opacities, likely mild edema. Small effusions. Electronically Signed   By: Rolm Baptise M.D.   On: 08/16/2020 12:37    Procedures .Critical Care  Date/Time: 08/16/2020 3:33 PM Performed by: Pattricia Boss, MD Authorized by: Pattricia Boss, MD   Critical care provider statement:    Critical care time (minutes):  65   Critical care end time:  08/16/2020 3:34 PM   Critical care was time spent personally by me on the following activities:  Discussions with consultants, evaluation of patient's response to treatment, examination of patient, ordering and performing treatments and interventions, ordering and review of laboratory studies, ordering and review of radiographic studies, pulse oximetry, re-evaluation of patient's condition, obtaining history from patient or surrogate and review of old charts   Medications Ordered in ED Medications  sodium chloride 0.9 % bolus 500 mL (has no administration in time range)  aspirin tablet 325 mg (has no administration in time range)  iohexol (OMNIPAQUE) 300 MG/ML solution 100 mL (100 mLs Intravenous Contrast Given 08/16/20 1408)    ED Course  I have reviewed the triage vital signs and the nursing notes.  Pertinent labs & imaging results that were available during my care of the patient were reviewed by me and considered in my medical decision making (see chart for details).  Clinical Course as of 08/16/20 1533  Sun Aug 16, 2020  1515 Hemoglobin(!): 9.2 [DR]    Clinical Course User Index [DR] Pattricia Boss, MD   MDM Rules/Calculators/A&P                          70 yo male presents with chronic chest pain with exacerbation yesterday.  EKG with prolonged qt and diffuse st depressions.  Potassium is elevated, with mildly decreased renal function-creatinine now 1.45 with last1.1 on 04/07/20. IV fluids being given and recheck potassium pending. Troponin elevated at 338 with cp at 2/10 Patient hypertensive- iv nitro ordered. Dr. Terrence Dupont paged Discussed with Dr. Terrence Dupont.  Heparin ordered He will be in to see patient 1 chest pain-abnormal EKG, elevated troponin, heparin nitro started 2-hyperkalemia patient receiving IV fluids and recheck potassium pending Dr. Terrence Dupont aware of above Final Clinical Impression(s) / ED Diagnoses Final diagnoses:  ACS (acute coronary syndrome) ([place])  Hyperkalemia  Anemia, unspecified type    Rx / DC Orders ED Discharge Orders     None        Pattricia Boss, MD 08/16/20 1531    Pattricia Boss, MD 08/16/20 1534

## 2020-08-16 NOTE — ED Triage Notes (Signed)
Pt reports lower abdominal pain, chest pain, shortness of breath, legs feeling weak x 2 weeks. States when having the abdominal pain he needs to sit down if not feels like :im going to pass out or die"  Reports increased urinary frequency x 2 weeks.

## 2020-08-16 NOTE — ED Triage Notes (Signed)
Pt to er room number 16 via ems, per ems pt is here for chest pain, htn and bradycardia, pt states that he has been having chest pain off and on for the past three weeks, states that it is better with rest, pt states that he has a hx of stent, resps even and unlabored, cardiac monitor placed, pt appears sinus brady with first degree block

## 2020-08-16 NOTE — Progress Notes (Signed)
Bed Placement assigned Pt to unit.  In reviewing Pt, Pt's b/p elevated and HR decreased - Pt on Nitro drip not stable, on heparin gtt with multiple issues, including withdraw from etoh - in the next 24 to 48 hours, if Pt is not withdrawing already.  I phoned [facility] and spoke to [facility] about the Pt. She advised to speak to MD and talk to them about higher level of care, and call them back if there were any issues. I also spoke to charge about this Pt, which she then spoke to our AD, and agreed that this Pt was not appropriate for our floor, that they did need high level of care, according to the notes from Dr. Terrence Dupont. ED called and was updated on the issues and advised of our concerns with the level of care of the Pt, and needed ICU level. I phoned coverage, and discussed with Westley Foots, He advised that this was Dr. Zenia Resides Pt and I needed to call him.  However, he did review chart, and felt that the Pt did possible need higher care, and [facility] did have a bed available and would hold for Pt, until I spoke to Dr. Terrence Dupont.  Dr. Terrence Dupont was paged and called back.  I explained to him our concerns, and that the Pt needed higher level of care, and that we discussed with with our AD, and she had agreed. Dr. Terrence Dupont then agreed to move the Pt to higher level of care, to ICU and move the Pt to [place] from the ED

## 2020-08-16 NOTE — ED Notes (Addendum)
[facility] was called, they did not have a truck available.   EMS called, no emergency transport requested and cardiac monitor.  Not able to contact ED charge nurse called.

## 2020-08-16 NOTE — Progress Notes (Signed)
ANTICOAGULATION CONSULT NOTE - Initial Consult  Pharmacy Consult for Heparin Indication: chest pain/ACS  Allergies  Allergen Reactions   Shellfish Allergy Diarrhea and Nausea And Vomiting    Oysters only     Patient Measurements: Height: 6' (182.9 cm) Weight: 77.1 kg (170 lb) IBW/kg (Calculated) : 77.6 Heparin Dosing Weight: 77.1 kg  Vital Signs: Temp: 98.4 F (36.9 C) (07/31 1449) Temp Source: Oral (07/31 1449) BP: 196/100 (07/31 1510) Pulse Rate: 56 (07/31 1510)  Labs: Recent Labs    08/16/20 1205  HGB 9.2*  HCT 31.6*  PLT 397  CREATININE 1.45*  TROPONINIHS 338*    Estimated Creatinine Clearance: 51 mL/min (A) (by C-G formula based on SCr of 1.45 mg/dL (H)).   Medical History: Past Medical History:  Diagnosis Date   Allergy    Anemia    Anxiety    Arthritis    Cataract    Depression    Hypertension    Myocardial infarction ([place])     Medications:  (Not in a hospital admission)  Scheduled:   aspirin  325 mg Oral Daily   Infusions:   heparin      Assessment: 61 yom presenting from urgent care with report of abnormal EKG with prolonged QTC and nonspecific ST depression. Troponin elevated in the ED at 338. Patient is also noted to be hyperkalemic. Nitroglycerin infusion running.  Patient takes eliquis at home for AF. Patient's last dose prior to arrival was 08/15/20 at night.  Patient's Hgb is 9.2 - down from March of this year. Plt 397.  Goal of Therapy:  Heparin level 0.3-0.7 units/ml aPTT 66-102 seconds Monitor platelets by anticoagulation protocol: Yes   Plan:  Baseline aPTT, PT/INR, and anti-Xa level ordered No initial bolus Start heparin infusion at 1000 units/hr Check aPTT / anti-Xa level in 6-8 hours and daily while on heparin Will need to check aPTT and anti-Xa levels until both levels correlate F/u cardiology recommendations Continue to monitor H&H and platelets  Lorelei Pont, PharmD, BCPS 08/16/2020 3:39 PM ED Clinical  Pharmacist -  [phone]

## 2020-08-16 NOTE — H&P (Signed)
[name] is an 70 y.o. male.   Chief Complaint: recurrent chest pain/abdominal pain HPI: patient is 70 year old male with past medical history significant for multivessel CAD, history of MI in the past, status post CABG in 1989 he had LIMA to LAD and saphenous vein graft to obtuse marginal 1.  Status post PTCA stenting to saphenous vein graft to OM1 and also has PCI the protected left main and left circumflex in December 2006, history of atrial flutter ablation in 2003.  History ofA. Fib with RVR, subsequently placed on amiodarone in January 2022 and converted back to sinus rhythm, on chronic anticoagulation, not followed in the office Since then,hypertension, hyperlipidemia, peripheral vascular disease status post PTA to right leg and attempted PTCA to left leg recently, tobacco abuse, EtOH abuse, history of gouty arthritis, came to the ER from urgent care complaining of right-sided abdominal pain and retrosternal chest pain radiating to both shoulders, grade 4/10 for 3 weeks, off and on associated with nausea, diaphoresis and mild shortness of breath.  Patient states he has not used any nitroglycerin recently.  But as a pain got worse, so decided to go to urgent care.  Patient had EKG done at urgent care where showed normal sinus rhythm with prolonged QTc interval and marked ST-T wave depression in inferior and anterolateral leads.  In ED patient was noted to have mildly elevated high sensitivity troponin are and also was noted to be anemic.  Patient denies any black tarry stools.  Denies any bright red blood per rectum.  Denies any hematuria.  Patient states he was placed on Plavix now takes occasionally, aspirin, and continues to take eliquis for atrial fibrillation.states continues to drink 34, beer and 45 shots of hard liquor daily and smokes less than half pack per day now.  States used to smoke one pack per day for 40+ years and now has cut down half pack per day and in process of quitting alcohol and  smoking  Past Medical History:  Diagnosis Date   Allergy    Anemia    Anxiety    Arthritis    Cataract    Depression    Hypertension    Myocardial infarction [facility])     Past Surgical History:  Procedure Laterality Date   ABDOMINAL AORTOGRAM W/LOWER EXTREMITY Bilateral 02/18/2020   Procedure: ABDOMINAL AORTOGRAM W/LOWER EXTREMITY;  Surgeon: Serafina Mitchell, MD;  Location: [place] [facility];  Service: Cardiovascular;  Laterality: Bilateral;   ABDOMINAL AORTOGRAM W/LOWER EXTREMITY N/A 03/10/2020   Procedure: ABDOMINAL AORTOGRAM W/LOWER EXTREMITY;  Surgeon: Serafina Mitchell, MD;  Location: [place] [facility];  Service: Cardiovascular;  Laterality: N/A;   ABDOMINAL AORTOGRAM W/LOWER EXTREMITY Left 04/07/2020   Procedure: ABDOMINAL AORTOGRAM W/LOWER EXTREMITY;  Surgeon: Serafina Mitchell, MD;  Location: [place] [facility];  Service: Cardiovascular;  Laterality: Left;   CORONARY ARTERY BYPASS GRAFT     EYE SURGERY     FRACTURE SURGERY     LEFT HEART CATHETERIZATION WITH CORONARY/GRAFT ANGIOGRAM N/A 07/09/2013   Procedure: LEFT HEART CATHETERIZATION WITH CORONARY/GRAFT ANGIOGRAM;  Surgeon: Clent Demark, MD;  Location: [place] [facility];  Service: Cardiovascular;  Laterality: N/A;   LOWER EXTREMITY ANGIOGRAPHY Left 04/07/2020   Procedure: Lower Extremity Angiography;  Surgeon: Serafina Mitchell, MD;  Location: [place] [facility];  Service: Cardiovascular;  Laterality: Left;   PERIPHERAL VASCULAR BALLOON ANGIOPLASTY Left 04/07/2020   Procedure: PERIPHERAL VASCULAR BALLOON ANGIOPLASTY;  Surgeon: Serafina Mitchell, MD;  Location: [place]  [facility];  Service: Cardiovascular;  Laterality: Left;  sfa   PERIPHERAL VASCULAR INTERVENTION Right 02/18/2020   Procedure: PERIPHERAL VASCULAR INTERVENTION;  Surgeon: Serafina Mitchell, MD;  Location: [place] [facility];  Service: Cardiovascular;  Laterality: Right;  SFA    Family History  Problem Relation Age of Onset   Diabetes Mother    Cancer Father     Hypertension Father    Stroke Father    Social History:  reports that he has been smoking cigarettes. He has been smoking an average of .5 packs per day. He has never used smokeless tobacco. He reports current alcohol use. He reports that he does not use drugs.  Allergies:  Allergies  Allergen Reactions   Shellfish Allergy Diarrhea and Nausea And Vomiting    Oysters only     (Not in a hospital admission)   Results for orders placed or performed during the hospital encounter of 08/16/20 (from the past 48 hour(s))  CBC     Status: Abnormal   Collection Time: 08/16/20 12:05 PM  Result Value Ref Range   WBC 9.5 4.0 - 10.5 K/uL   RBC 4.36 4.22 - 5.81 MIL/uL   Hemoglobin 9.2 (L) 13.0 - 17.0 g/dL   HCT 31.6 (L) 39.0 - 52.0 %   MCV 72.5 (L) 80.0 - 100.0 fL   MCH 21.1 (L) 26.0 - 34.0 pg   MCHC 29.1 (L) 30.0 - 36.0 g/dL   RDW 23.3 (H) 11.5 - 15.5 %   Platelets 397 150 - 400 K/uL   nRBC 0.4 (H) 0.0 - 0.2 %    Comment: Performed at [facility], [address], [place], [place] [address]  Comprehensive metabolic panel     Status: Abnormal   Collection Time: 08/16/20 12:05 PM  Result Value Ref Range   Sodium 134 (L) 135 - 145 mmol/L   Potassium 7.1 (HH) 3.5 - 5.1 mmol/L    Comment: CRITICAL RESULT CALLED TO, READ BACK BY AND VERIFIED WITH: T DEVOLT RN BY SSTEPHENS [id number]    Chloride 101 98 - 111 mmol/L   CO2 24 22 - 32 mmol/L   Glucose, Bld 106 (H) 70 - 99 mg/dL    Comment: Glucose reference range applies only to samples taken after fasting for at least 8 hours.   BUN 20 8 - 23 mg/dL   Creatinine, Ser 1.45 (H) 0.61 - 1.24 mg/dL   Calcium 9.6 8.9 - 10.3 mg/dL   Total Protein 8.1 6.5 - 8.1 g/dL   Albumin 3.3 (L) 3.5 - 5.0 g/dL   AST 69 (H) 15 - 41 U/L   ALT 30 0 - 44 U/L   Alkaline Phosphatase 88 38 - 126 U/L   Total Bilirubin 0.8 0.3 - 1.2 mg/dL   GFR, Estimated 52 (L) >60 mL/min    Comment: (NOTE) Calculated using the CKD-EPI Creatinine Equation (2021)    Anion  gap 9 5 - 15    Comment: Performed at [facility] [address] [place], [place] [address]  Troponin I (High Sensitivity)     Status: Abnormal   Collection Time: 08/16/20 12:05 PM  Result Value Ref Range   Troponin I (High Sensitivity) 338 (HH) <18 ng/L    Comment: CRITICAL RESULT CALLED TO, READ BACK BY AND VERIFIED WITH: T DEVOLT RN BY SSTEPHENS [id number] (NOTE) Elevated high sensitivity troponin I (hsTnI) values and significant  changes across serial measurements may suggest ACS but many other  chronic  and acute conditions are known to elevate hsTnI results.  Refer to the Links section for chest pain algorithms and additional  guidance. Performed at [facility], [place] [address], [place], [place] [address]   Troponin I (High Sensitivity)     Status: Abnormal   Collection Time: 08/16/20  2:05 PM  Result Value Ref Range   Troponin I (High Sensitivity) 323 (HH) <18 ng/L    Comment: CRITICAL VALUE NOTED.  VALUE IS CONSISTENT WITH PREVIOUSLY REPORTED AND CALLED VALUE. (NOTE) Elevated high sensitivity troponin I (hsTnI) values and significant  changes across serial measurements may suggest ACS but many other  chronic and acute conditions are known to elevate hsTnI results.  Refer to the Links section for chest pain algorithms and additional  guidance. Performed at [facility], [employer] [place] [employer] [address], [place], [place] [address]   Potassium     Status: None   Collection Time: 08/16/20  2:05 PM  Result Value Ref Range   Potassium 5.1 3.5 - 5.1 mmol/L    Comment: Performed at [facility], [place] [address], [place], [place] [address]   CT ABDOMEN PELVIS W CONTRAST  Result Date: 08/16/2020 CLINICAL DATA:  Right lower quadrant pain EXAM: CT ABDOMEN AND PELVIS WITH CONTRAST TECHNIQUE: Multidetector CT imaging of the abdomen and pelvis was performed using the standard protocol following bolus administration of intravenous contrast. CONTRAST:  130m OMNIPAQUE IOHEXOL  300 MG/ML  SOLN COMPARISON:  CT abdomen 05/15/2008 FINDINGS: Lower chest: Bilateral small pleural effusions and adjacent linear opacities which could reflect atelectasis. Hepatobiliary: There is a cyst in the left hepatic lobe measuring 3.3 cm an additional scattered tiny hypodensities are too small to fully characterize but likely represent small cysts. Gallbladder surgically absent. No new intra or extrahepatic biliary duct dilation. Pancreas: Unremarkable. No pancreatic ductal dilatation or surrounding inflammatory changes. Spleen: Normal in size without focal abnormality. Adrenals/Urinary Tract: Adrenal glands are unremarkable. Kidneys are normal, without renal calculi, focal lesion, or hydronephrosis. Bladder appears slightly thick-walled but also under distended. Stomach/Bowel: Stomach is within normal limits. Appendix appears normal. No evidence of bowel wall thickening, distention, or inflammatory changes. Vascular/Lymphatic: Severe aortic atherosclerosis. No enlarged abdominal or pelvic lymph nodes. Reproductive: Prostate is unremarkable. Other: No abdominopelvic ascites. Musculoskeletal: Severe degenerative disc disease at L4-5. IMPRESSION: 1. No acute intra-abdominal pathology to explain the patient's right lower quadrant pain. Normal appendix. 2. Urinary bladder appears slightly thick-walled but also under distended. Recommend correlation with clinical history and possible urinalysis. 3. Bilateral small pleural effusions and adjacent linear opacities which could reflect atelectasis. 4. Severe aortic atherosclerosis.  No aneurysm. Aortic Atherosclerosis (ICD10-I70.0). Electronically Signed   By: NAudie PintoM.D.   On: 08/16/2020 14:28   DG Chest Port 1 View  Result Date: 08/16/2020 CLINICAL DATA:  Chest pain EXAM: PORTABLE CHEST 1 VIEW COMPARISON:  09/21/2012 FINDINGS: Cardiomegaly, vascular congestion. Perihilar opacities likely reflect edema. Small bilateral effusions. No acute bony  abnormality. Prior CABG. IMPRESSION: Cardiomegaly with vascular congestion and perihilar opacities, likely mild edema. Small effusions. Electronically Signed   By: KRolm BaptiseM.D.   On: 08/16/2020 12:37    Review of Systems  Constitutional:  Positive for diaphoresis.  HENT:  Negative for nosebleeds and sore throat.   Eyes:  Positive for redness.  Respiratory:  Positive for shortness of breath.   Cardiovascular:  Positive for chest pain. Negative for palpitations and leg swelling.  Gastrointestinal:  Positive for abdominal pain and nausea. Negative for blood in  stool.  Endocrine: Negative for cold intolerance.  Genitourinary:  Negative for difficulty urinating and hematuria.  Neurological:  Negative for dizziness.  Hematological:  Does not bruise/bleed easily.   Blood pressure (!) 196/105, pulse (!) 57, temperature 97.9 F (36.6 C), temperature source Oral, resp. rate 20, height 6' (1.829 m), weight 77.1 kg, SpO2 99 %. Physical Exam Constitutional:      Appearance: He is well-developed.  HENT:     Head: Normocephalic and atraumatic.  Eyes:     Extraocular Movements: Extraocular movements intact.     Pupils: Pupils are equal, round, and reactive to light.  Neck:     Vascular: No JVD.  Cardiovascular:     Rate and Rhythm: Regular rhythm. Bradycardia present.     Heart sounds: Murmur heard.  Systolic murmur is present with a grade of 2/6.    Gallop present. S4 sounds present.  Pulmonary:     Effort: Pulmonary effort is normal.     Comments: Decreased breath sounds at bases Abdominal:     General: Bowel sounds are normal.     Palpations: Abdomen is soft. There is no hepatomegaly or mass.     Tenderness: There is no abdominal tenderness. There is no guarding.  Musculoskeletal:     Cervical back: Normal range of motion and neck supple.  Skin:    General: Skin is warm and dry.  Neurological:     General: No focal deficit present.     Mental Status: He is alert and oriented to  person, place, and time.     Assessment/Plan Acute coronary syndrome. Multivessel CAD, status post CABG in the past. History of MI 2 in the past, status post PCI to the protected left main and left circumflex and PTCA stenting to saphenous vein graft to obtuse marginal 1 in the past Hypertension. Hyperlipidemia. Peripheral vascular disease. EtOH abuse. Tobacco abuse. History of recurrent atrial fibrillation on chronic anticoagulation. Gouty arthritis. Microcytic anemia, rule out GI loss versus blood loss during recent peripheral angioplasty. Prolonged QTC, secondary to amiodarone Plan As per orders. We will hold eliquis and start heparin without bolus. Check stool for occult blood. May need GI evaluation if further significant drop in hemoglobin Discussed with patient and his wife at length regarding mildly elevated hypersensitivity Troponin I and various options of treatment, I.e., invasive, left,cardiac catheterization possible PTCA stenting its risks and benefits, I.e., death, MI, stroke, need for emergency CABG, local vascular complications, risk of bleeding, infection, radial versus femoral approach.his risk and benefit and states he prefers femoral approach.  Charolette Forward, MD 08/16/2020, 4:20 PM

## 2020-08-16 NOTE — ED Provider Notes (Signed)
Was called to triage to assess patient who has been having shortness of breath, abdominal pain, and chest pain for the past few days. Significant history for patient includes CABG in 1989 and 4 stent placements since this CABG. EKG showing "Critical Test Result: Long QTc Sinus bradycardia with sinus arrhythmia with 1st degree A-V block, Marked ST abnormality, possible inferior subendocardial injury, Prolonged QT, Abnormal ECG". Patient also have markedly elevated BP and tachypnea. Ambulance called to take patient to the hospital.    Odis Luster, FNP 08/16/20 1126

## 2020-08-16 NOTE — ED Notes (Signed)
Attempted report X1

## 2020-08-16 NOTE — ED Notes (Signed)
Patient is being discharged from the Urgent Care and sent to the Emergency Department via [facility] . Per Phillips Odor, PA, patient is in need of higher level of care due to Chest pain. Patient is aware and verbalizes understanding of plan of care.  Vitals:   08/16/20 1054  BP: (!) 181/78  Pulse: (!) 54  Resp: 18  Temp: 98.2 F (36.8 C)  SpO2: 97%

## 2020-08-16 NOTE — ED Notes (Signed)
Date and time results received: 08/16/20 1351   Test: Trop and K+ Critical Value: 338 and 7.1  Name of Provider Notified: Ray

## 2020-08-16 NOTE — ED Notes (Signed)
[facility] is at patient's bedside.  Wife is at bedside

## 2020-08-17 ENCOUNTER — Inpatient Hospital Stay (HOSPITAL_COMMUNITY): Payer: Medicare HMO

## 2020-08-17 LAB — ECHOCARDIOGRAM COMPLETE
AR max vel: 1.86 cm2
AV Area VTI: 1.75 cm2
AV Area mean vel: 1.62 cm2
AV Mean grad: 4 mmHg
AV Peak grad: 8 mmHg
Ao pk vel: 1.41 m/s
Area-P 1/2: 4.68 cm2
Calc EF: 45.5 %
Height: 72 in
MV M vel: 5.19 m/s
MV Peak grad: 107.7 mmHg
MV VTI: 1.41 cm2
S' Lateral: 5 cm
Single Plane A2C EF: 57.1 %
Single Plane A4C EF: 24.2 %
Weight: 2720 oz

## 2020-08-17 LAB — BASIC METABOLIC PANEL
Anion gap: 8 (ref 5–15)
BUN: 22 mg/dL (ref 8–23)
CO2: 26 mmol/L (ref 22–32)
Calcium: 9.3 mg/dL (ref 8.9–10.3)
Chloride: 100 mmol/L (ref 98–111)
Creatinine, Ser: 1.4 mg/dL — ABNORMAL HIGH (ref 0.61–1.24)
GFR, Estimated: 54 mL/min — ABNORMAL LOW (ref >60)
Glucose, Bld: 122 mg/dL — ABNORMAL HIGH (ref 70–99)
Potassium: 4.3 mmol/L (ref 3.5–5.1)
Sodium: 134 mmol/L — ABNORMAL LOW (ref 135–145)

## 2020-08-17 LAB — CBC
HCT: 29.8 % — ABNORMAL LOW (ref 39.0–52.0)
Hemoglobin: 8.6 g/dL — ABNORMAL LOW (ref 13.0–17.0)
MCH: 20.9 pg — ABNORMAL LOW (ref 26.0–34.0)
MCHC: 28.9 g/dL — ABNORMAL LOW (ref 30.0–36.0)
MCV: 72.3 fL — ABNORMAL LOW (ref 80.0–100.0)
Platelets: 406 10*3/uL — ABNORMAL HIGH (ref 150–400)
RBC: 4.12 MIL/uL — ABNORMAL LOW (ref 4.22–5.81)
RDW: 22.8 % — ABNORMAL HIGH (ref 11.5–15.5)
WBC: 9.7 10*3/uL (ref 4.0–10.5)
nRBC: 0.4 % — ABNORMAL HIGH (ref 0.0–0.2)

## 2020-08-17 LAB — LIPID PANEL
Cholesterol: 128 mg/dL (ref 0–200)
HDL: 33 mg/dL — ABNORMAL LOW (ref >40)
LDL Cholesterol: 84 mg/dL (ref 0–99)
Total CHOL/HDL Ratio: 3.9 RATIO
Triglycerides: 55 mg/dL (ref <150)
VLDL: 11 mg/dL (ref 0–40)

## 2020-08-17 LAB — TROPONIN I (HIGH SENSITIVITY)
Troponin I (High Sensitivity): 400 ng/L (ref <18)
Troponin I (High Sensitivity): 397 ng/L (ref <18)

## 2020-08-17 LAB — RESP PANEL BY RT-PCR (FLU A&B, COVID) ARPGX2
Influenza A by PCR: NEGATIVE
Influenza B by PCR: NEGATIVE
SARS Coronavirus 2 by RT PCR: NEGATIVE

## 2020-08-17 LAB — HEPARIN LEVEL (UNFRACTIONATED): Heparin Unfractionated: >1.1 IU/mL — ABNORMAL HIGH (ref 0.30–0.70)

## 2020-08-17 LAB — MRSA NEXT GEN BY PCR, NASAL: MRSA by PCR Next Gen: NOT DETECTED

## 2020-08-17 LAB — APTT: aPTT: 91 seconds — ABNORMAL HIGH (ref 24–36)

## 2020-08-17 MED ORDER — CLOPIDOGREL BISULFATE 75 MG PO TABS
75.0000 mg | ORAL_TABLET | Freq: Every day | ORAL | Status: AC
  Administered 2020-08-17: 75 mg via ORAL
  Filled 2020-08-17: qty 1

## 2020-08-17 MED ORDER — SACUBITRIL-VALSARTAN 24-26 MG PO TABS
1.0000 | ORAL_TABLET | Freq: Two times a day (BID) | ORAL | Status: DC
  Administered 2020-08-17 – 2020-08-20 (×5): 1 via ORAL
  Filled 2020-08-17 (×6): qty 1

## 2020-08-17 MED ORDER — SODIUM CHLORIDE 0.9 % IV SOLN: INTRAVENOUS | Status: DC

## 2020-08-17 MED ORDER — ASPIRIN 81 MG PO CHEW
81.0000 mg | CHEWABLE_TABLET | ORAL | Status: AC
  Administered 2020-08-18: 81 mg via ORAL
  Filled 2020-08-17: qty 1

## 2020-08-17 MED ORDER — CARVEDILOL 6.25 MG PO TABS
6.2500 mg | ORAL_TABLET | Freq: Two times a day (BID) | ORAL | Status: DC
  Administered 2020-08-17 – 2020-08-20 (×5): 6.25 mg via ORAL
  Filled 2020-08-17 (×6): qty 1

## 2020-08-17 MED ORDER — CHLORHEXIDINE GLUCONATE CLOTH 2 % EX PADS
6.0000 | MEDICATED_PAD | Freq: Every day | CUTANEOUS | Status: DC
  Administered 2020-08-17: 6 via TOPICAL

## 2020-08-17 MED ORDER — SODIUM CHLORIDE 0.9% FLUSH
3.0000 mL | Freq: Two times a day (BID) | INTRAVENOUS | Status: DC
  Administered 2020-08-17 – 2020-08-20 (×5): 3 mL via INTRAVENOUS

## 2020-08-17 NOTE — Progress Notes (Signed)
Subjective:  Patient denies any chest pain or shortness of breath.  High sensitivity troponin I remains mildly elevated.  Metoprolol was held yesterday due to marked bradycardia.  Heart rate has improved  Objective:  Vital Signs in the last 24 hours: Temp:  [97.9 F (36.6 C)-99 F (37.2 C)] 98.2 F (36.8 C) (08/01 0800) Pulse Rate:  [38-137] 59 (08/01 1100) Resp:  [12-83] 41 (08/01 0900) BP: (115-198)/(74-153) 173/92 (08/01 1108) SpO2:  [87 %-100 %] 99 % (08/01 1100) Weight:  [77.1 kg] 77.1 kg (07/31 1155)  Intake/Output from previous day: 07/31 0701 - 08/01 0700 In: 844.4 [I.V.:344.4; IV Piggyback:500] Out: 175 [Urine:175] Intake/Output from this shift: Total I/O In: 59.9 [I.V.:59.9] Out: 200 [Urine:200]  Physical Exam: Neck: no adenopathy, no carotid bruit, no JVD, and supple, symmetrical, trachea midline Lungs: clear to auscultation bilaterally Heart: regular rate and rhythm, S1, S2 normal, and 2/6 systolic murmur noted Abdomen: soft, non-tender; bowel sounds normal; no masses,  no organomegaly Extremities: extremities normal, atraumatic, no cyanosis or edema  Lab Results: Recent Labs    08/16/20 1205 08/17/20 0741  WBC 9.5 9.7  HGB 9.2* 8.6*  PLT 397 406*   Recent Labs    08/16/20 1205 08/16/20 1405 08/17/20 0741  NA 134*  --  134*  K 7.1* 5.1 4.3  CL 101  --  100  CO2 24  --  26  GLUCOSE 106*  --  122*  BUN 20  --  22  CREATININE 1.45*  --  1.40*   No results for input(s): TROPONINI in the last 72 hours.  Invalid input(s): CK, MB Hepatic Function Panel Recent Labs    08/16/20 1205  PROT 8.1  ALBUMIN 3.3*  AST 69*  ALT 30  ALKPHOS 88  BILITOT 0.8   Recent Labs    08/17/20 0741  CHOL 128   No results for input(s): PROTIME in the last 72 hours.  Imaging: CT ABDOMEN PELVIS W CONTRAST  Result Date: 08/16/2020 CLINICAL DATA:  Right lower quadrant pain EXAM: CT ABDOMEN AND PELVIS WITH CONTRAST TECHNIQUE: Multidetector CT imaging of the  abdomen and pelvis was performed using the standard protocol following bolus administration of intravenous contrast. CONTRAST:  159m OMNIPAQUE IOHEXOL 300 MG/ML  SOLN COMPARISON:  CT abdomen 05/15/2008 FINDINGS: Lower chest: Bilateral small pleural effusions and adjacent linear opacities which could reflect atelectasis. Hepatobiliary: There is a cyst in the left hepatic lobe measuring 3.3 cm an additional scattered tiny hypodensities are too small to fully characterize but likely represent small cysts. Gallbladder surgically absent. No new intra or extrahepatic biliary duct dilation. Pancreas: Unremarkable. No pancreatic ductal dilatation or surrounding inflammatory changes. Spleen: Normal in size without focal abnormality. Adrenals/Urinary Tract: Adrenal glands are unremarkable. Kidneys are normal, without renal calculi, focal lesion, or hydronephrosis. Bladder appears slightly thick-walled but also under distended. Stomach/Bowel: Stomach is within normal limits. Appendix appears normal. No evidence of bowel wall thickening, distention, or inflammatory changes. Vascular/Lymphatic: Severe aortic atherosclerosis. No enlarged abdominal or pelvic lymph nodes. Reproductive: Prostate is unremarkable. Other: No abdominopelvic ascites. Musculoskeletal: Severe degenerative disc disease at L4-5. IMPRESSION: 1. No acute intra-abdominal pathology to explain the patient's right lower quadrant pain. Normal appendix. 2. Urinary bladder appears slightly thick-walled but also under distended. Recommend correlation with clinical history and possible urinalysis. 3. Bilateral small pleural effusions and adjacent linear opacities which could reflect atelectasis. 4. Severe aortic atherosclerosis.  No aneurysm. Aortic Atherosclerosis (ICD10-I70.0). Electronically Signed   By: NAudie PintoM.D.   On:  08/16/2020 14:28   DG Chest Port 1 View  Result Date: 08/16/2020 CLINICAL DATA:  Chest pain EXAM: PORTABLE CHEST 1 VIEW  COMPARISON:  09/21/2012 FINDINGS: Cardiomegaly, vascular congestion. Perihilar opacities likely reflect edema. Small bilateral effusions. No acute bony abnormality. Prior CABG. IMPRESSION: Cardiomegaly with vascular congestion and perihilar opacities, likely mild edema. Small effusions. Electronically Signed   By: Rolm Baptise M.D.   On: 08/16/2020 12:37   ECHOCARDIOGRAM COMPLETE  Result Date: 08/17/2020    ECHOCARDIOGRAM REPORT   Patient Name:   [name] Date of Exam: 08/17/2020 Medical Rec #:  [id number]     Height:       72.0 in Accession #:    [id number]    Weight:       170.0 lb Date of Birth:  05-19-1950     BSA:          1.988 m Patient Age:    70 years      BP:           157/76 mmHg Patient Gender: M             HR:           64 bpm. Exam Location:  Inpatient Procedure: 2D Echo, Cardiac Doppler and Color Doppler Indications:    Acute MI  History:        Patient has no prior history of Echocardiogram examinations.                 Previous Myocardial Infarction; Risk Factors:Dyslipidemia. 1989                 CABG with stents                 07/09/2013 cath.  Sonographer:    Luisa Hart RDCS Referring Phys: [place]  1. Left ventricular ejection fraction, by estimation, is 35 to 40%. The left ventricle has moderately decreased function. The left ventricle demonstrates global hypokinesis. The left ventricular internal cavity size was mildly dilated. There is mild left ventricular hypertrophy. Left ventricular diastolic function could not be evaluated.  2. Right ventricular systolic function is normal. The right ventricular size is normal. There is normal pulmonary artery systolic pressure.  3. Left atrial size was mildly dilated.  4. The mitral valve is normal in structure. Mild to moderate mitral valve regurgitation.  5. The aortic valve is normal in structure. Aortic valve regurgitation is not visualized.  6. The inferior vena cava is normal in size with greater than 50% respiratory  variability, suggesting right atrial pressure of 3 mmHg. FINDINGS  Left Ventricle: Left ventricular ejection fraction, by estimation, is 35 to 40%. The left ventricle has moderately decreased function. The left ventricle demonstrates global hypokinesis. The left ventricular internal cavity size was mildly dilated. There is mild left ventricular hypertrophy. Left ventricular diastolic function could not be evaluated. Right Ventricle: The right ventricular size is normal. No increase in right ventricular wall thickness. Right ventricular systolic function is normal. There is normal pulmonary artery systolic pressure. The tricuspid regurgitant velocity is 2.80 m/s, and  with an assumed right atrial pressure of 3 mmHg, the estimated right ventricular systolic pressure is [id number] mmHg. Left Atrium: Left atrial size was mildly dilated. Right Atrium: Right atrial size was normal in size. Pericardium: There is no evidence of pericardial effusion. Mitral Valve: The mitral valve is normal in structure. Mild to moderate mitral valve regurgitation. MV peak gradient, 8.1 mmHg. The mean mitral  valve gradient is 4.0 mmHg. Tricuspid Valve: The tricuspid valve is normal in structure. Tricuspid valve regurgitation is mild. Aortic Valve: The aortic valve is normal in structure. Aortic valve regurgitation is not visualized. Aortic valve mean gradient measures 4.0 mmHg. Aortic valve peak gradient measures 8.0 mmHg. Aortic valve area, by VTI measures 1.75 cm. Pulmonic Valve: The pulmonic valve was normal in structure. Pulmonic valve regurgitation is not visualized. Aorta: The aortic root is normal in size and structure. Venous: The inferior vena cava is normal in size with greater than 50% respiratory variability, suggesting right atrial pressure of 3 mmHg. IAS/Shunts: No atrial level shunt detected by color flow Doppler.  LEFT VENTRICLE PLAX 2D LVIDd:         5.20 cm      Diastology LVIDs:         5.00 cm      LV e' medial:    4.00 cm/s  LV PW:         1.50 cm      LV E/e' medial:  31.0 LV IVS:        1.30 cm      LV e' lateral:   5.68 cm/s LVOT diam:     1.80 cm      LV E/e' lateral: 21.8 LV SV:         48 LV SV Index:   24 LVOT Area:     2.54 cm  LV Volumes (MOD) LV vol d, MOD A2C: 149.0 ml LV vol d, MOD A4C: 99.2 ml LV vol s, MOD A2C: 63.9 ml LV vol s, MOD A4C: 75.2 ml LV SV MOD A2C:     85.1 ml LV SV MOD A4C:     99.2 ml LV SV MOD BP:      57.7 ml RIGHT VENTRICLE RV Basal diam:  3.60 cm RV Mid diam:    1.20 cm TAPSE (M-mode): 1.7 cm LEFT ATRIUM              Index       RIGHT ATRIUM           Index LA diam:        5.10 cm  2.56 cm/m  RA Area:     17.70 cm LA Vol (A2C):   105.0 ml 52.81 ml/m RA Volume:   48.50 ml  24.39 ml/m LA Vol (A4C):   72.2 ml  36.31 ml/m LA Biplane Vol: 89.5 ml  45.01 ml/m  AORTIC VALVE                   PULMONIC VALVE AV Area (Vmax):    1.86 cm    PV Vmax:       0.98 m/s AV Area (Vmean):   1.62 cm    PV Vmean:      66.900 cm/s AV Area (VTI):     1.75 cm    PV VTI:        0.175 m AV Vmax:           141.00 cm/s PV Peak grad:  3.9 mmHg AV Vmean:          95.000 cm/s PV Mean grad:  2.0 mmHg AV VTI:            0.275 m AV Peak Grad:      8.0 mmHg AV Mean Grad:      4.0 mmHg LVOT Vmax:         103.00 cm/s LVOT Vmean:  60.300 cm/s LVOT VTI:          0.189 m LVOT/AV VTI ratio: 0.69  AORTA Ao Root diam: 3.10 cm Ao Asc diam:  2.70 cm MITRAL VALVE                TRICUSPID VALVE MV Area (PHT): 4.68 cm     TR Peak grad:   31.4 mmHg MV Area VTI:   1.41 cm     TR Vmax:        280.00 cm/s MV Peak grad:  8.1 mmHg MV Mean grad:  4.0 mmHg     SHUNTS MV Vmax:       1.42 m/s     Systemic VTI:  0.19 m MV Vmean:      86.0 cm/s    Systemic Diam: 1.80 cm MV Decel Time: 162 msec MR Peak grad: 107.7 mmHg MR Mean grad: 71.5 mmHg MR Vmax:      519.00 cm/s MR Vmean:     399.5 cm/s MV E velocity: 124.00 cm/s MV A velocity: 96.80 cm/s MV E/A ratio:  1.28 Charolette Forward MD Electronically signed by Charolette Forward MD Signature Date/Time:  08/17/2020/11:27:46 AM    Final     Cardiac Studies:  Assessment/Plan:  Acute coronary syndrome. Multivessel CAD, status post CABG in the past. History of MI 2 in the past, status post PCI to the protected left main and left circumflex and PTCA stenting to saphenous vein graft to obtuse marginal 1 in the past Ischemic cardiomyopathy Hypertension. Hyperlipidemia. Peripheral vascular disease. EtOH abuse. Tobacco abuse. History of recurrent atrial fibrillation on chronic anticoagulation. Gouty arthritis. Microcytic anemia, rule out GI loss versus blood loss during recent peripheral angioplasty. Prolonged QTC, secondary to amiodarone Chronic kidney disease stage II Plan As per orders. Will DC metoprolol and start carvedilol and switch losartan to entresto  Discussed again with patient regarding left cardiac catheterization, possible PTCA stenting.its risk and benefits, I.e., death, MI, stroke, need for emergency CABG, local vascular complications, worsening renal function, bleeding, infection, etc., and consents for PCI. Recheck labs in a.m.  LOS: 1 day    Charolette Forward 08/17/2020, 11:42 AM

## 2020-08-17 NOTE — Progress Notes (Signed)
ANTICOAGULATION CONSULT NOTE - Follow Up Consult  Pharmacy Consult for Heparin Indication: chest pain/ACS  Allergies  Allergen Reactions   Shellfish Allergy Diarrhea and Nausea And Vomiting    Oysters only     Patient Measurements: Height: 6' (182.9 cm) Weight: 77.1 kg (170 lb) IBW/kg (Calculated) : 77.6 Heparin Dosing Weight: 77.1 kg  Vital Signs: Temp: 98.2 F (36.8 C) (08/01 0800) Temp Source: Oral (08/01 0800) BP: 173/92 (08/01 1108) Pulse Rate: 59 (08/01 1100)  Labs: Recent Labs    08/16/20 1205 08/16/20 1405 08/16/20 1613 08/16/20 2147 08/16/20 2310 08/17/20 0228 08/17/20 0741  HGB 9.2*  --   --   --   --   --  8.6*  HCT 31.6*  --   --   --   --   --  29.8*  PLT 397  --   --   --   --   --  406*  APTT  --   --  34  --  47*  --  91*  LABPROT  --   --  17.1*  --   --   --   --   INR  --   --  1.4*  --   --   --   --   HEPARINUNFRC  --   --  >1.10*  --  >1.10*  --  >1.10*  CREATININE 1.45*  --   --   --   --   --  1.40*  TROPONINIHS 338* 323*  --  400*  --  397*  --      Estimated Creatinine Clearance: 52.8 mL/min (A) (by C-G formula based on SCr of 1.4 mg/dL (H)).   Medical History: Past Medical History:  Diagnosis Date   Allergy    Anemia    Anxiety    Arthritis    Cataract    Depression    Hypertension    Myocardial infarction [facility])     Medications:  Medications Prior to Admission  Medication Sig Dispense Refill Last Dose   allopurinol (ZYLOPRIM) 300 MG tablet Take 300 mg by mouth daily.   Past Week   amiodarone (PACERONE) 200 MG tablet Take 200 mg by mouth daily.   Past Week   aspirin EC 81 MG tablet Take 81 mg by mouth 2 (two) times a week.   Past Week   atorvastatin (LIPITOR) 40 MG tablet Take 1 tablet (40 mg total) by mouth daily. (Patient taking differently: Take 40 mg by mouth every evening.) 90 tablet 3 Past Week   cilostazol (PLETAL) 100 MG tablet Take 1 tablet (100 mg total) by mouth 2 (two) times daily before a meal. (Patient  taking differently: Take 100 mg by mouth daily.) 60 tablet 11 Past Week   clopidogrel (PLAVIX) 75 MG tablet Take 1 tablet (75 mg total) by mouth daily. 30 tablet 11 Past Week   ELIQUIS 5 MG TABS tablet Take 5 mg by mouth 2 (two) times daily.   08/15/2020 at 2200   losartan (COZAAR) 25 MG tablet Take 25 mg by mouth daily.   Past Week   metoprolol succinate (TOPROL-XL) 100 MG 24 hr tablet Take 100 mg by mouth 2 (two) times daily. Take with or immediately following a meal.   08/15/2020 at 2200   nicotine polacrilex (COMMIT) 4 MG lozenge Take 4 mg by mouth as needed for smoking cessation.      nitroGLYCERIN (NITROSTAT) 0.4 MG SL tablet Place 0.4 mg under the tongue every 5 (  five) minutes x 3 doses as needed for chest pain.      Scheduled:   amLODipine  2.5 mg Oral Daily   aspirin EC  81 mg Oral Daily   atorvastatin  80 mg Oral Daily   carvedilol  6.25 mg Oral BID WC   Chlorhexidine Gluconate Cloth  6 each Topical Daily   pantoprazole  40 mg Oral Q0600   sacubitril-valsartan  1 tablet Oral BID   sodium chloride flush  3 mL Intravenous Q12H   Infusions:   sodium chloride     heparin 1,200 Units/hr (08/17/20 1100)   nitroGLYCERIN 10 mcg/min (08/17/20 1100)    Assessment: 75 yom presenting from urgent care with report of abnormal EKG with prolonged QTC and nonspecific ST depression. Troponin elevated in the ED at 338. Patient is also noted to be hyperkalemic. Nitroglycerin infusion running.  Patient takes eliquis at home for AF. Patient's last dose prior to arrival was 08/15/20 at night.  Heparin drip 1200 uts/hr aptt at goal 91sec h/h and pltc stable no bleeding noted   Goal of Therapy:  Heparin level 0.3-0.7 units/ml aPTT 66-102 seconds Monitor platelets by anticoagulation protocol: Yes   Plan:  Continue heparin drip 1200 uts/hr  Aptt/heparin level daily Monitor s/s bleeding   Bonnita Nasuti Pharm.D. CPP, BCPS Clinical Pharmacist [phone] 08/17/2020 12:10 PM

## 2020-08-17 NOTE — Progress Notes (Addendum)
Pt HR decreased to the 40's. Slight change in pt heart rhythm noted on monitor. Pt denies pain. EKG obtained, see chart. Dr. Terrence Dupont paged. Awaiting response  08/17/20 0214  Vitals  BP (!) 150/81  Pulse Rate (!) 47  ECG Heart Rate (!) 50  Resp 16  Oxygen Therapy  SpO2 95 %  Pain Assessment  Pain Scale 0-10  Pain Score 0  MEWS Score  MEWS Temp 0  MEWS Systolic 0  MEWS Pulse 1  MEWS RR 0  MEWS LOC 0  MEWS Score 1  MEWS Score Color [name]

## 2020-08-17 NOTE — Progress Notes (Signed)
Dr. Terrence Dupont left instructions to hold pt beta blockers until further notice. Will hold meds and report changes to MD as needed.

## 2020-08-17 NOTE — H&P (View-Only) (Signed)
Subjective:  Patient denies any chest pain or shortness of breath.  High sensitivity troponin I remains mildly elevated.  Metoprolol was held yesterday due to marked bradycardia.  Heart rate has improved  Objective:  Vital Signs in the last 24 hours: Temp:  [97.9 F (36.6 C)-99 F (37.2 C)] 98.2 F (36.8 C) (08/01 0800) Pulse Rate:  [38-137] 59 (08/01 1100) Resp:  [12-83] 41 (08/01 0900) BP: (115-198)/(74-153) 173/92 (08/01 1108) SpO2:  [87 %-100 %] 99 % (08/01 1100) Weight:  [77.1 kg] 77.1 kg (07/31 1155)  Intake/Output from previous day: 07/31 0701 - 08/01 0700 In: 844.4 [I.V.:344.4; IV Piggyback:500] Out: 175 [Urine:175] Intake/Output from this shift: Total I/O In: 59.9 [I.V.:59.9] Out: 200 [Urine:200]  Physical Exam: Neck: no adenopathy, no carotid bruit, no JVD, and supple, symmetrical, trachea midline Lungs: clear to auscultation bilaterally Heart: regular rate and rhythm, S1, S2 normal, and 2/6 systolic murmur noted Abdomen: soft, non-tender; bowel sounds normal; no masses,  no organomegaly Extremities: extremities normal, atraumatic, no cyanosis or edema  Lab Results: Recent Labs    08/16/20 1205 08/17/20 0741  WBC 9.5 9.7  HGB 9.2* 8.6*  PLT 397 406*   Recent Labs    08/16/20 1205 08/16/20 1405 08/17/20 0741  NA 134*  --  134*  K 7.1* 5.1 4.3  CL 101  --  100  CO2 24  --  26  GLUCOSE 106*  --  122*  BUN 20  --  22  CREATININE 1.45*  --  1.40*   No results for input(s): TROPONINI in the last 72 hours.  Invalid input(s): CK, MB Hepatic Function Panel Recent Labs    08/16/20 1205  PROT 8.1  ALBUMIN 3.3*  AST 69*  ALT 30  ALKPHOS 88  BILITOT 0.8   Recent Labs    08/17/20 0741  CHOL 128   No results for input(s): PROTIME in the last 72 hours.  Imaging: CT ABDOMEN PELVIS W CONTRAST  Result Date: 08/16/2020 CLINICAL DATA:  Right lower quadrant pain EXAM: CT ABDOMEN AND PELVIS WITH CONTRAST TECHNIQUE: Multidetector CT imaging of the  abdomen and pelvis was performed using the standard protocol following bolus administration of intravenous contrast. CONTRAST:  12m OMNIPAQUE IOHEXOL 300 MG/ML  SOLN COMPARISON:  CT abdomen 05/15/2008 FINDINGS: Lower chest: Bilateral small pleural effusions and adjacent linear opacities which could reflect atelectasis. Hepatobiliary: There is a cyst in the left hepatic lobe measuring 3.3 cm an additional scattered tiny hypodensities are too small to fully characterize but likely represent small cysts. Gallbladder surgically absent. No new intra or extrahepatic biliary duct dilation. Pancreas: Unremarkable. No pancreatic ductal dilatation or surrounding inflammatory changes. Spleen: Normal in size without focal abnormality. Adrenals/Urinary Tract: Adrenal glands are unremarkable. Kidneys are normal, without renal calculi, focal lesion, or hydronephrosis. Bladder appears slightly thick-walled but also under distended. Stomach/Bowel: Stomach is within normal limits. Appendix appears normal. No evidence of bowel wall thickening, distention, or inflammatory changes. Vascular/Lymphatic: Severe aortic atherosclerosis. No enlarged abdominal or pelvic lymph nodes. Reproductive: Prostate is unremarkable. Other: No abdominopelvic ascites. Musculoskeletal: Severe degenerative disc disease at L4-5. IMPRESSION: 1. No acute intra-abdominal pathology to explain the patient's right lower quadrant pain. Normal appendix. 2. Urinary bladder appears slightly thick-walled but also under distended. Recommend correlation with clinical history and possible urinalysis. 3. Bilateral small pleural effusions and adjacent linear opacities which could reflect atelectasis. 4. Severe aortic atherosclerosis.  No aneurysm. Aortic Atherosclerosis (ICD10-I70.0). Electronically Signed   By: NAudie PintoM.D.   On:  08/16/2020 14:28   DG Chest Port 1 View  Result Date: 08/16/2020 CLINICAL DATA:  Chest pain EXAM: PORTABLE CHEST 1 VIEW  COMPARISON:  09/21/2012 FINDINGS: Cardiomegaly, vascular congestion. Perihilar opacities likely reflect edema. Small bilateral effusions. No acute bony abnormality. Prior CABG. IMPRESSION: Cardiomegaly with vascular congestion and perihilar opacities, likely mild edema. Small effusions. Electronically Signed   By: Rolm Baptise M.D.   On: 08/16/2020 12:37   ECHOCARDIOGRAM COMPLETE  Result Date: 08/17/2020    ECHOCARDIOGRAM REPORT   Patient Name:   ALFREDO DIEKMANN Date of Exam: 08/17/2020 Medical Rec #:  [id number]     Height:       72.0 in Accession #:    [id number]    Weight:       170.0 lb Date of Birth:  [birth year]     BSA:          1.988 m Patient Age:    58 years      BP:           157/76 mmHg Patient Gender: M             HR:           64 bpm. Exam Location:  Inpatient Procedure: 2D Echo, Cardiac Doppler and Color Doppler Indications:    Acute MI  History:        Patient has no prior history of Echocardiogram examinations.                 Previous Myocardial Infarction; Risk Factors:Dyslipidemia. 1989                 CABG with stents                 07/09/2013 cath.  Sonographer:    Luisa Hart RDCS Referring Phys: [place]  1. Left ventricular ejection fraction, by estimation, is 35 to 40%. The left ventricle has moderately decreased function. The left ventricle demonstrates global hypokinesis. The left ventricular internal cavity size was mildly dilated. There is mild left ventricular hypertrophy. Left ventricular diastolic function could not be evaluated.  2. Right ventricular systolic function is normal. The right ventricular size is normal. There is normal pulmonary artery systolic pressure.  3. Left atrial size was mildly dilated.  4. The mitral valve is normal in structure. Mild to moderate mitral valve regurgitation.  5. The aortic valve is normal in structure. Aortic valve regurgitation is not visualized.  6. The inferior vena cava is normal in size with greater than 50% respiratory  variability, suggesting right atrial pressure of 3 mmHg. FINDINGS  Left Ventricle: Left ventricular ejection fraction, by estimation, is 35 to 40%. The left ventricle has moderately decreased function. The left ventricle demonstrates global hypokinesis. The left ventricular internal cavity size was mildly dilated. There is mild left ventricular hypertrophy. Left ventricular diastolic function could not be evaluated. Right Ventricle: The right ventricular size is normal. No increase in right ventricular wall thickness. Right ventricular systolic function is normal. There is normal pulmonary artery systolic pressure. The tricuspid regurgitant velocity is 2.80 m/s, and  with an assumed right atrial pressure of 3 mmHg, the estimated right ventricular systolic pressure is 99991111 mmHg. Left Atrium: Left atrial size was mildly dilated. Right Atrium: Right atrial size was normal in size. Pericardium: There is no evidence of pericardial effusion. Mitral Valve: The mitral valve is normal in structure. Mild to moderate mitral valve regurgitation. MV peak gradient, 8.1 mmHg. The mean mitral  valve gradient is 4.0 mmHg. Tricuspid Valve: The tricuspid valve is normal in structure. Tricuspid valve regurgitation is mild. Aortic Valve: The aortic valve is normal in structure. Aortic valve regurgitation is not visualized. Aortic valve mean gradient measures 4.0 mmHg. Aortic valve peak gradient measures 8.0 mmHg. Aortic valve area, by VTI measures 1.75 cm. Pulmonic Valve: The pulmonic valve was normal in structure. Pulmonic valve regurgitation is not visualized. Aorta: The aortic root is normal in size and structure. Venous: The inferior vena cava is normal in size with greater than 50% respiratory variability, suggesting right atrial pressure of 3 mmHg. IAS/Shunts: No atrial level shunt detected by color flow Doppler.  LEFT VENTRICLE PLAX 2D LVIDd:         5.20 cm      Diastology LVIDs:         5.00 cm      LV e' medial:    4.00 cm/s  LV PW:         1.50 cm      LV E/e' medial:  31.0 LV IVS:        1.30 cm      LV e' lateral:   5.68 cm/s LVOT diam:     1.80 cm      LV E/e' lateral: 21.8 LV SV:         48 LV SV Index:   24 LVOT Area:     2.54 cm  LV Volumes (MOD) LV vol d, MOD A2C: 149.0 ml LV vol d, MOD A4C: 99.2 ml LV vol s, MOD A2C: 63.9 ml LV vol s, MOD A4C: 75.2 ml LV SV MOD A2C:     85.1 ml LV SV MOD A4C:     99.2 ml LV SV MOD BP:      57.7 ml RIGHT VENTRICLE RV Basal diam:  3.60 cm RV Mid diam:    1.20 cm TAPSE (M-mode): 1.7 cm LEFT ATRIUM              Index       RIGHT ATRIUM           Index LA diam:        5.10 cm  2.56 cm/m  RA Area:     17.70 cm LA Vol (A2C):   105.0 ml 52.81 ml/m RA Volume:   48.50 ml  24.39 ml/m LA Vol (A4C):   72.2 ml  36.31 ml/m LA Biplane Vol: 89.5 ml  45.01 ml/m  AORTIC VALVE                   PULMONIC VALVE AV Area (Vmax):    1.86 cm    PV Vmax:       0.98 m/s AV Area (Vmean):   1.62 cm    PV Vmean:      66.900 cm/s AV Area (VTI):     1.75 cm    PV VTI:        0.175 m AV Vmax:           141.00 cm/s PV Peak grad:  3.9 mmHg AV Vmean:          95.000 cm/s PV Mean grad:  2.0 mmHg AV VTI:            0.275 m AV Peak Grad:      8.0 mmHg AV Mean Grad:      4.0 mmHg LVOT Vmax:         103.00 cm/s LVOT Vmean:  60.300 cm/s LVOT VTI:          0.189 m LVOT/AV VTI ratio: 0.69  AORTA Ao Root diam: 3.10 cm Ao Asc diam:  2.70 cm MITRAL VALVE                TRICUSPID VALVE MV Area (PHT): 4.68 cm     TR Peak grad:   31.4 mmHg MV Area VTI:   1.41 cm     TR Vmax:        280.00 cm/s MV Peak grad:  8.1 mmHg MV Mean grad:  4.0 mmHg     SHUNTS MV Vmax:       1.42 m/s     Systemic VTI:  0.19 m MV Vmean:      86.0 cm/s    Systemic Diam: 1.80 cm MV Decel Time: 162 msec MR Peak grad: 107.7 mmHg MR Mean grad: 71.5 mmHg MR Vmax:      519.00 cm/s MR Vmean:     399.5 cm/s MV E velocity: 124.00 cm/s MV A velocity: 96.80 cm/s MV E/A ratio:  1.28 Charolette Forward MD Electronically signed by Charolette Forward MD Signature Date/Time:  08/17/2020/11:27:46 AM    Final     Cardiac Studies:  Assessment/Plan:  Acute coronary syndrome. Multivessel CAD, status post CABG in the past. History of MI 2 in the past, status post PCI to the protected left main and left circumflex and PTCA stenting to saphenous vein graft to obtuse marginal 1 in the past Ischemic cardiomyopathy Hypertension. Hyperlipidemia. Peripheral vascular disease. EtOH abuse. Tobacco abuse. History of recurrent atrial fibrillation on chronic anticoagulation. Gouty arthritis. Microcytic anemia, rule out GI loss versus blood loss during recent peripheral angioplasty. Prolonged QTC, secondary to amiodarone Chronic kidney disease stage II Plan As per orders. Will DC metoprolol and start carvedilol and switch losartan to entresto  Discussed again with patient regarding left cardiac catheterization, possible PTCA stenting.its risk and benefits, I.e., death, MI, stroke, need for emergency CABG, local vascular complications, worsening renal function, bleeding, infection, etc., and consents for PCI. Recheck labs in a.m.  LOS: 1 day    Charolette Forward 08/17/2020, 11:42 AM

## 2020-08-17 NOTE — Progress Notes (Signed)
Patient admitted to [facility]. Patient ambulatory to bed from stretcher with a steady gait/standby assist. A&OX4. Patient states he only drinks maybe two or three beers a  few days each week with an occasional shot throughout the week. Last drink per patient was Friday July 29. Patient oriented to unit and use of bed and call bell. Bath and CHG completed gown changed, and patient in a position of stated comfort.

## 2020-08-17 NOTE — Plan of Care (Signed)

## 2020-08-17 NOTE — Progress Notes (Signed)
*  PRELIMINARY RESULTS* Echocardiogram 2D Echocardiogram has been performed.  Luisa Hart RDCS 08/17/2020, 9:57 AM

## 2020-08-18 ENCOUNTER — Encounter (HOSPITAL_COMMUNITY): Payer: Self-pay | Admitting: Cardiology

## 2020-08-18 ENCOUNTER — Inpatient Hospital Stay (HOSPITAL_COMMUNITY)
Admission: EM | Disposition: A | Discharge status: Home / Self Care | Payer: Self-pay | Source: Home / Self Care | Attending: Cardiology

## 2020-08-18 HISTORY — PX: LEFT HEART CATH AND CORS/GRAFTS ANGIOGRAPHY: CATH118250

## 2020-08-18 LAB — CBC
HCT: 26.9 % — ABNORMAL LOW (ref 39.0–52.0)
Hemoglobin: 8 g/dL — ABNORMAL LOW (ref 13.0–17.0)
MCH: 20.9 pg — ABNORMAL LOW (ref 26.0–34.0)
MCHC: 29.7 g/dL — ABNORMAL LOW (ref 30.0–36.0)
MCV: 70.4 fL — ABNORMAL LOW (ref 80.0–100.0)
Platelets: 322 10*3/uL (ref 150–400)
RBC: 3.82 MIL/uL — ABNORMAL LOW (ref 4.22–5.81)
RDW: 22.5 % — ABNORMAL HIGH (ref 11.5–15.5)
WBC: 8.5 10*3/uL (ref 4.0–10.5)
nRBC: 0.4 % — ABNORMAL HIGH (ref 0.0–0.2)

## 2020-08-18 LAB — BASIC METABOLIC PANEL
Anion gap: 8 (ref 5–15)
BUN: 15 mg/dL (ref 8–23)
CO2: 25 mmol/L (ref 22–32)
Calcium: 8.8 mg/dL — ABNORMAL LOW (ref 8.9–10.3)
Chloride: 102 mmol/L (ref 98–111)
Creatinine, Ser: 1.18 mg/dL (ref 0.61–1.24)
GFR, Estimated: >60 mL/min (ref >60)
Glucose, Bld: 96 mg/dL (ref 70–99)
Potassium: 4.1 mmol/L (ref 3.5–5.1)
Sodium: 135 mmol/L (ref 135–145)

## 2020-08-18 LAB — POCT ACTIVATED CLOTTING TIME: Activated Clotting Time: 161 seconds

## 2020-08-18 LAB — HEPARIN LEVEL (UNFRACTIONATED): Heparin Unfractionated: 0.62 IU/mL (ref 0.30–0.70)

## 2020-08-18 LAB — APTT: aPTT: 91 seconds — ABNORMAL HIGH (ref 24–36)

## 2020-08-18 SURGERY — LEFT HEART CATH AND CORS/GRAFTS ANGIOGRAPHY
Anesthesia: LOCAL

## 2020-08-18 MED ORDER — MIDAZOLAM HCL 2 MG/2ML IJ SOLN
INTRAMUSCULAR | Status: DC | PRN
  Administered 2020-08-18: 1 mg via INTRAVENOUS

## 2020-08-18 MED ORDER — HEPARIN (PORCINE) IN NACL 1000-0.9 UT/500ML-% IV SOLN
INTRAVENOUS | Status: AC
  Filled 2020-08-18: qty 500

## 2020-08-18 MED ORDER — FENTANYL CITRATE (PF) 100 MCG/2ML IJ SOLN
INTRAMUSCULAR | Status: DC | PRN
  Administered 2020-08-18: 25 ug via INTRAVENOUS

## 2020-08-18 MED ORDER — SODIUM CHLORIDE 0.9% FLUSH: 3.0000 mL | INTRAVENOUS | Status: DC | PRN

## 2020-08-18 MED ORDER — LIDOCAINE HCL (PF) 1 % IJ SOLN
INTRAMUSCULAR | Status: DC | PRN
  Administered 2020-08-18: 15 mL via SUBCUTANEOUS

## 2020-08-18 MED ORDER — SODIUM CHLORIDE 0.9 % IV SOLN: 250.0000 mL | INTRAVENOUS | Status: DC | PRN

## 2020-08-18 MED ORDER — LIDOCAINE HCL (PF) 1 % IJ SOLN
INTRAMUSCULAR | Status: AC
  Filled 2020-08-18: qty 30

## 2020-08-18 MED ORDER — HYDRALAZINE HCL 20 MG/ML IJ SOLN: 10.0000 mg | INTRAMUSCULAR | Status: AC | PRN

## 2020-08-18 MED ORDER — MIDAZOLAM HCL 2 MG/2ML IJ SOLN
INTRAMUSCULAR | Status: AC
  Filled 2020-08-18: qty 2

## 2020-08-18 MED ORDER — HEPARIN SODIUM (PORCINE) 1000 UNIT/ML IJ SOLN
INTRAMUSCULAR | Status: AC
  Filled 2020-08-18: qty 1

## 2020-08-18 MED ORDER — SODIUM CHLORIDE 0.9 % IV SOLN: INTRAVENOUS | Status: AC

## 2020-08-18 MED ORDER — FENTANYL CITRATE (PF) 100 MCG/2ML IJ SOLN
INTRAMUSCULAR | Status: AC
  Filled 2020-08-18: qty 2

## 2020-08-18 MED ORDER — VERAPAMIL HCL 2.5 MG/ML IV SOLN
INTRAVENOUS | Status: AC
  Filled 2020-08-18: qty 2

## 2020-08-18 MED ORDER — SODIUM CHLORIDE 0.9 % IV SOLN: INTRAVENOUS | Status: DC

## 2020-08-18 MED ORDER — IOHEXOL 350 MG/ML SOLN
INTRAVENOUS | Status: DC | PRN
  Administered 2020-08-18: 45 mL via INTRA_ARTERIAL

## 2020-08-18 MED ORDER — ACETAMINOPHEN 325 MG PO TABS: 650.0000 mg | ORAL_TABLET | ORAL | Status: DC | PRN

## 2020-08-18 MED ORDER — SODIUM CHLORIDE 0.9% FLUSH
3.0000 mL | Freq: Two times a day (BID) | INTRAVENOUS | Status: DC
  Administered 2020-08-18 – 2020-08-20 (×4): 3 mL via INTRAVENOUS

## 2020-08-18 MED ORDER — ONDANSETRON HCL 4 MG/2ML IJ SOLN: 4.0000 mg | Freq: Four times a day (QID) | INTRAMUSCULAR | Status: DC | PRN

## 2020-08-18 SURGICAL SUPPLY — 7 items
CATH INFINITI 5FR MULTPACK ANG (CATHETERS) ×1 IMPLANT
KIT HEART LEFT (KITS) ×2 IMPLANT
PACK CARDIAC CATHETERIZATION (CUSTOM PROCEDURE TRAY) ×2 IMPLANT
SHEATH PINNACLE 5F 10CM (SHEATH) ×1 IMPLANT
SYR MEDRAD MARK 7 150ML (SYRINGE) ×2 IMPLANT
TRANSDUCER W/STOPCOCK (MISCELLANEOUS) ×2 IMPLANT
WIRE EMERALD 3MM-J .035X150CM (WIRE) ×1 IMPLANT

## 2020-08-18 NOTE — Interval H&P Note (Signed)
Cath Lab Visit (complete for each Cath Lab visit)  Clinical Evaluation Leading to the Procedure:   ACS: Yes.    Non-ACS:    Anginal Classification: CCS IV  Anti-ischemic medical therapy: Maximal Therapy (2 or more classes of medications)  Non-Invasive Test Results: No non-invasive testing performed  Prior CABG: Previous CABG      History and Physical Interval Note:  08/18/2020 8:17 AM  [name]  has presented today for surgery, with the diagnosis of chest pain.  The various methods of treatment have been discussed with the patient and family. After consideration of risks, benefits and other options for treatment, the patient has consented to  Procedure(s): LEFT HEART CATH AND CORS/GRAFTS ANGIOGRAPHY (N/A) as a surgical intervention.  The patient's history has been reviewed, patient examined, no change in status, stable for surgery.  I have reviewed the patient's chart and labs.  Questions were answered to the patient's satisfaction.     Charolette Forward

## 2020-08-18 NOTE — Consult Note (Signed)
Reason for Consult: Chest pain, right side pain, and anemia Referring Physician: Art Buff HPI: This is a 71 year old male with a PMH of CAD s/p CABG and PTCA,. HTN, PVD, and hyperlipidemia admitted to the hospital for ACS.  He was complaining about right-sided abdominal pain and  chest pain for the past several weeks.  The pain in his righ side worsened with ambulation.  As a result of his symptoms he presented to Urgent Care and his EKG showed some ST segment depressions and a prolonged QTc.  Blood work showed that his HGB declined from the 10-12 g/dL range this past February down to his current 8.0 g/dL MCV 70.  He does not report any issues with hematochezia, melena, or hematemesis.  His cardiac catherization today showed the expected CAD and medical management was recommended.  The last routine colonoscopy with Dr. Collene Mares was on 02/2016 and he was only identified to have one tubular adenoma in the cecum.  In 2010 and 2011 he was evaluated with an EGD/colonoscopy and VCE for IDA, but no bleedingsource was identified.  His HGB at that time dropped to 6.7 g/dL.   Past Medical History:  Diagnosis Date   Allergy    Anemia    Anxiety    Arthritis    Cataract    Depression    Hypertension    Myocardial infarction [facility])     Past Surgical History:  Procedure Laterality Date   ABDOMINAL AORTOGRAM W/LOWER EXTREMITY Bilateral 02/18/2020   Procedure: ABDOMINAL AORTOGRAM W/LOWER EXTREMITY;  Surgeon: Serafina Mitchell, MD;  Location: [place] [facility];  Service: Cardiovascular;  Laterality: Bilateral;   ABDOMINAL AORTOGRAM W/LOWER EXTREMITY N/A 03/10/2020   Procedure: ABDOMINAL AORTOGRAM W/LOWER EXTREMITY;  Surgeon: Serafina Mitchell, MD;  Location: [place] [facility];  Service: Cardiovascular;  Laterality: N/A;   ABDOMINAL AORTOGRAM W/LOWER EXTREMITY Left 04/07/2020   Procedure: ABDOMINAL AORTOGRAM W/LOWER EXTREMITY;  Surgeon: Serafina Mitchell, MD;  Location: [place] [facility];  Service:  Cardiovascular;  Laterality: Left;   CORONARY ARTERY BYPASS GRAFT     EYE SURGERY     FRACTURE SURGERY     LEFT HEART CATHETERIZATION WITH CORONARY/GRAFT ANGIOGRAM N/A 07/09/2013   Procedure: LEFT HEART CATHETERIZATION WITH CORONARY/GRAFT ANGIOGRAM;  Surgeon: Clent Demark, MD;  Location: [place] CATH LAB;  Service: Cardiovascular;  Laterality: N/A;   LOWER EXTREMITY ANGIOGRAPHY Left 04/07/2020   Procedure: Lower Extremity Angiography;  Surgeon: Serafina Mitchell, MD;  Location: [place] [facility];  Service: Cardiovascular;  Laterality: Left;   PERIPHERAL VASCULAR BALLOON ANGIOPLASTY Left 04/07/2020   Procedure: PERIPHERAL VASCULAR BALLOON ANGIOPLASTY;  Surgeon: Serafina Mitchell, MD;  Location: [place] [facility];  Service: Cardiovascular;  Laterality: Left;  sfa   PERIPHERAL VASCULAR INTERVENTION Right 02/18/2020   Procedure: PERIPHERAL VASCULAR INTERVENTION;  Surgeon: Serafina Mitchell, MD;  Location: [place] [facility];  Service: Cardiovascular;  Laterality: Right;  SFA    Family History  Problem Relation Age of Onset   Diabetes Mother    Cancer Father    Hypertension Father    Stroke Father     Social History:  reports that he has been smoking cigarettes. He has been smoking an average of .5 packs per day. He has never used smokeless tobacco. He reports current alcohol use. He reports that he does not use drugs.  Allergies:  Allergies  Allergen Reactions   Shellfish Allergy Diarrhea and Nausea And Vomiting  Oysters only     Medications: Scheduled:  [MAR Hold] amLODipine  2.5 mg Oral Daily   [MAR Hold] aspirin EC  81 mg Oral Daily   [MAR Hold] atorvastatin  80 mg Oral Daily   [MAR Hold] carvedilol  6.25 mg Oral BID WC   [MAR Hold] Chlorhexidine Gluconate Cloth  6 each Topical Daily   [MAR Hold] pantoprazole  40 mg Oral Q0600   [MAR Hold] sacubitril-valsartan  1 tablet Oral BID   [MAR Hold] sodium chloride flush  3 mL Intravenous Q12H   Continuous:  sodium chloride     sodium  chloride     sodium chloride 50 mL/hr at 08/18/20 0800   heparin 1,200 Units/hr (08/18/20 0800)   nitroGLYCERIN 10 mcg/min (08/18/20 0800)    Results for orders placed or performed during the hospital encounter of 08/16/20 (from the past 24 hour(s))  APTT     Status: Abnormal   Collection Time: 08/18/20  2:04 AM  Result Value Ref Range   aPTT 91 (H) 24 - 36 seconds  CBC     Status: Abnormal   Collection Time: 08/18/20  2:04 AM  Result Value Ref Range   WBC 8.5 4.0 - 10.5 K/uL   RBC 3.82 (L) 4.22 - 5.81 MIL/uL   Hemoglobin 8.0 (L) 13.0 - 17.0 g/dL   HCT 26.9 (L) 39.0 - 52.0 %   MCV 70.4 (L) 80.0 - 100.0 fL   MCH 20.9 (L) 26.0 - 34.0 pg   MCHC 29.7 (L) 30.0 - 36.0 g/dL   RDW 22.5 (H) 11.5 - 15.5 %   Platelets 322 150 - 400 K/uL   nRBC 0.4 (H) 0.0 - 0.2 %  Heparin level (unfractionated)     Status: None   Collection Time: 08/18/20  2:04 AM  Result Value Ref Range   Heparin Unfractionated 0.62 0.30 - 0.70 IU/mL  Basic metabolic panel     Status: Abnormal   Collection Time: 08/18/20  2:04 AM  Result Value Ref Range   Sodium 135 135 - 145 mmol/L   Potassium 4.1 3.5 - 5.1 mmol/L   Chloride 102 98 - 111 mmol/L   CO2 25 22 - 32 mmol/L   Glucose, Bld 96 70 - 99 mg/dL   BUN 15 8 - 23 mg/dL   Creatinine, Ser 1.18 0.61 - 1.24 mg/dL   Calcium 8.8 (L) 8.9 - 10.3 mg/dL   GFR, Estimated >60 >60 mL/min   Anion gap 8 5 - 15     CARDIAC CATHETERIZATION  Result Date: 08/18/2020 Formatting of this result is different from the original.   Ost LAD to Prox LAD lesion is 100% stenosed.   Prox RCA to Mid RCA lesion is 20% stenosed.   Mid LM to Dist LM lesion is 20% stenosed.   Ost Cx to Prox Cx lesion is 30% stenosed.   1st Mrg lesion is 100% stenosed.   Prox Cx to Mid Cx lesion is 30% stenosed.   ECHOCARDIOGRAM COMPLETE  Result Date: 08/17/2020    ECHOCARDIOGRAM REPORT   Patient Name:   [name] Date of Exam: 08/17/2020 Medical Rec #:  [id number]     Height:       72.0 in Accession #:     [id number]    Weight:       170.0 lb Date of Birth:  Aug 03, 1950     BSA:          1.988 m Patient Age:    64 years  BP:           157/76 mmHg Patient Gender: M             HR:           64 bpm. Exam Location:  Inpatient Procedure: 2D Echo, Cardiac Doppler and Color Doppler Indications:    Acute MI  History:        Patient has no prior history of Echocardiogram examinations.                 Previous Myocardial Infarction; Risk Factors:Dyslipidemia. 1989                 CABG with stents                 07/09/2013 cath.  Sonographer:    Luisa Hart RDCS Referring Phys: [place]  1. Left ventricular ejection fraction, by estimation, is 35 to 40%. The left ventricle has moderately decreased function. The left ventricle demonstrates global hypokinesis. The left ventricular internal cavity size was mildly dilated. There is mild left ventricular hypertrophy. Left ventricular diastolic function could not be evaluated.  2. Right ventricular systolic function is normal. The right ventricular size is normal. There is normal pulmonary artery systolic pressure.  3. Left atrial size was mildly dilated.  4. The mitral valve is normal in structure. Mild to moderate mitral valve regurgitation.  5. The aortic valve is normal in structure. Aortic valve regurgitation is not visualized.  6. The inferior vena cava is normal in size with greater than 50% respiratory variability, suggesting right atrial pressure of 3 mmHg. FINDINGS  Left Ventricle: Left ventricular ejection fraction, by estimation, is 35 to 40%. The left ventricle has moderately decreased function. The left ventricle demonstrates global hypokinesis. The left ventricular internal cavity size was mildly dilated. There is mild left ventricular hypertrophy. Left ventricular diastolic function could not be evaluated. Right Ventricle: The right ventricular size is normal. No increase in right ventricular wall thickness. Right ventricular systolic  function is normal. There is normal pulmonary artery systolic pressure. The tricuspid regurgitant velocity is 2.80 m/s, and  with an assumed right atrial pressure of 3 mmHg, the estimated right ventricular systolic pressure is [id number] mmHg. Left Atrium: Left atrial size was mildly dilated. Right Atrium: Right atrial size was normal in size. Pericardium: There is no evidence of pericardial effusion. Mitral Valve: The mitral valve is normal in structure. Mild to moderate mitral valve regurgitation. MV peak gradient, 8.1 mmHg. The mean mitral valve gradient is 4.0 mmHg. Tricuspid Valve: The tricuspid valve is normal in structure. Tricuspid valve regurgitation is mild. Aortic Valve: The aortic valve is normal in structure. Aortic valve regurgitation is not visualized. Aortic valve mean gradient measures 4.0 mmHg. Aortic valve peak gradient measures 8.0 mmHg. Aortic valve area, by VTI measures 1.75 cm. Pulmonic Valve: The pulmonic valve was normal in structure. Pulmonic valve regurgitation is not visualized. Aorta: The aortic root is normal in size and structure. Venous: The inferior vena cava is normal in size with greater than 50% respiratory variability, suggesting right atrial pressure of 3 mmHg. IAS/Shunts: No atrial level shunt detected by color flow Doppler.  LEFT VENTRICLE PLAX 2D LVIDd:         5.20 cm      Diastology LVIDs:         5.00 cm      LV e' medial:    4.00 cm/s LV PW:  1.50 cm      LV E/e' medial:  31.0 LV IVS:        1.30 cm      LV e' lateral:   5.68 cm/s LVOT diam:     1.80 cm      LV E/e' lateral: 21.8 LV SV:         48 LV SV Index:   24 LVOT Area:     2.54 cm  LV Volumes (MOD) LV vol d, MOD A2C: 149.0 ml LV vol d, MOD A4C: 99.2 ml LV vol s, MOD A2C: 63.9 ml LV vol s, MOD A4C: 75.2 ml LV SV MOD A2C:     85.1 ml LV SV MOD A4C:     99.2 ml LV SV MOD BP:      57.7 ml RIGHT VENTRICLE RV Basal diam:  3.60 cm RV Mid diam:    1.20 cm TAPSE (M-mode): 1.7 cm LEFT ATRIUM              Index       RIGHT  ATRIUM           Index LA diam:        5.10 cm  2.56 cm/m  RA Area:     17.70 cm LA Vol (A2C):   105.0 ml 52.81 ml/m RA Volume:   48.50 ml  24.39 ml/m LA Vol (A4C):   72.2 ml  36.31 ml/m LA Biplane Vol: 89.5 ml  45.01 ml/m  AORTIC VALVE                   PULMONIC VALVE AV Area (Vmax):    1.86 cm    PV Vmax:       0.98 m/s AV Area (Vmean):   1.62 cm    PV Vmean:      66.900 cm/s AV Area (VTI):     1.75 cm    PV VTI:        0.175 m AV Vmax:           141.00 cm/s PV Peak grad:  3.9 mmHg AV Vmean:          95.000 cm/s PV Mean grad:  2.0 mmHg AV VTI:            0.275 m AV Peak Grad:      8.0 mmHg AV Mean Grad:      4.0 mmHg LVOT Vmax:         103.00 cm/s LVOT Vmean:        60.300 cm/s LVOT VTI:          0.189 m LVOT/AV VTI ratio: 0.69  AORTA Ao Root diam: 3.10 cm Ao Asc diam:  2.70 cm MITRAL VALVE                TRICUSPID VALVE MV Area (PHT): 4.68 cm     TR Peak grad:   31.4 mmHg MV Area VTI:   1.41 cm     TR Vmax:        280.00 cm/s MV Peak grad:  8.1 mmHg MV Mean grad:  4.0 mmHg     SHUNTS MV Vmax:       1.42 m/s     Systemic VTI:  0.19 m MV Vmean:      86.0 cm/s    Systemic Diam: 1.80 cm MV Decel Time: 162 msec MR Peak grad: 107.7 mmHg MR Mean grad: 71.5 mmHg MR Vmax:      519.00  cm/s MR Vmean:     399.5 cm/s MV E velocity: 124.00 cm/s MV A velocity: 96.80 cm/s MV E/A ratio:  1.28 Charolette Forward MD Electronically signed by Charolette Forward MD Signature Date/Time: 08/17/2020/11:27:46 AM    Final     ROS:  As stated above in the HPI otherwise negative.  Blood pressure (!) 157/69, pulse 60, temperature 98.1 F (36.7 C), temperature source Oral, resp. rate 18, height 6' (1.829 m), weight 77.5 kg, SpO2 100 %.    PE: Gen: NAD, Alert and Oriented HEENT:  [place]/AT, EOMI Neck: Supple, no LAD Lungs: CTA Bilaterally CV: RRR without M/G/R ABD: Soft, NTND, +BS Ext: No C/C/E  Assessment/Plan: 1) Anemia. 2) CAD - medical management. 3) Chest pain. 4) Right sided abdominal pain.   The patient is stable from  the cardiac standpoint.  From Dr. Zenia Resides note he will be medically managed.  He has a long history of an anemia and the prior work up in the past was negative.  His most recent colonoscopy was in 2018 and it was negative for any significant findings.  With the drop in his HGB it is reasonable to perform an EGD as his last EGD was in 2010.  Plan: 1) EGD tomorrow with Dr. Collene Mares.  Victorian Gunn D 08/18/2020, 3:15 PM

## 2020-08-19 ENCOUNTER — Encounter (HOSPITAL_COMMUNITY)
Admission: EM | Disposition: A | Discharge status: Home / Self Care | Payer: Self-pay | Source: Home / Self Care | Attending: Cardiology

## 2020-08-19 ENCOUNTER — Encounter (HOSPITAL_COMMUNITY): Payer: Self-pay | Admitting: Cardiology

## 2020-08-19 ENCOUNTER — Inpatient Hospital Stay (HOSPITAL_COMMUNITY): Payer: Medicare HMO | Admitting: Certified Registered"

## 2020-08-19 HISTORY — PX: ESOPHAGOGASTRODUODENOSCOPY (EGD) WITH PROPOFOL: SHX5813

## 2020-08-19 LAB — APTT: aPTT: 31 seconds (ref 24–36)

## 2020-08-19 LAB — BASIC METABOLIC PANEL
Anion gap: 7 (ref 5–15)
BUN: 13 mg/dL (ref 8–23)
CO2: 28 mmol/L (ref 22–32)
Calcium: 8.8 mg/dL — ABNORMAL LOW (ref 8.9–10.3)
Chloride: 101 mmol/L (ref 98–111)
Creatinine, Ser: 1.11 mg/dL (ref 0.61–1.24)
GFR, Estimated: >60 mL/min (ref >60)
Glucose, Bld: 99 mg/dL (ref 70–99)
Potassium: 4.1 mmol/L (ref 3.5–5.1)
Sodium: 136 mmol/L (ref 135–145)

## 2020-08-19 LAB — CBC
HCT: 30.3 % — ABNORMAL LOW (ref 39.0–52.0)
Hemoglobin: 8.8 g/dL — ABNORMAL LOW (ref 13.0–17.0)
MCH: 20.8 pg — ABNORMAL LOW (ref 26.0–34.0)
MCHC: 29 g/dL — ABNORMAL LOW (ref 30.0–36.0)
MCV: 71.5 fL — ABNORMAL LOW (ref 80.0–100.0)
Platelets: 366 10*3/uL (ref 150–400)
RBC: 4.24 MIL/uL (ref 4.22–5.81)
RDW: 23 % — ABNORMAL HIGH (ref 11.5–15.5)
WBC: 8.4 10*3/uL (ref 4.0–10.5)
nRBC: 0.2 % (ref 0.0–0.2)

## 2020-08-19 LAB — HEMOGLOBIN A1C
Hgb A1c MFr Bld: 5.7 % — ABNORMAL HIGH (ref 4.8–5.6)
Mean Plasma Glucose: 116.89 mg/dL

## 2020-08-19 SURGERY — ESOPHAGOGASTRODUODENOSCOPY (EGD) WITH PROPOFOL
Anesthesia: Monitor Anesthesia Care

## 2020-08-19 MED ORDER — CLOPIDOGREL BISULFATE 75 MG PO TABS
75.0000 mg | ORAL_TABLET | Freq: Every day | ORAL | Status: DC
  Administered 2020-08-19 – 2020-08-20 (×2): 75 mg via ORAL
  Filled 2020-08-19 (×2): qty 1

## 2020-08-19 MED ORDER — PROPOFOL 500 MG/50ML IV EMUL
INTRAVENOUS | Status: DC | PRN
  Administered 2020-08-19: 100 ug/kg/min via INTRAVENOUS

## 2020-08-19 MED ORDER — APIXABAN 5 MG PO TABS
5.0000 mg | ORAL_TABLET | Freq: Two times a day (BID) | ORAL | Status: DC
  Administered 2020-08-19 – 2020-08-20 (×2): 5 mg via ORAL
  Filled 2020-08-19 (×2): qty 1

## 2020-08-19 MED ORDER — LACTATED RINGERS IV SOLN: INTRAVENOUS | Status: DC

## 2020-08-19 MED ORDER — PROPOFOL 10 MG/ML IV BOLUS
INTRAVENOUS | Status: DC | PRN
  Administered 2020-08-19: 20 mg via INTRAVENOUS
  Administered 2020-08-19: 30 mg via INTRAVENOUS

## 2020-08-19 SURGICAL SUPPLY — 15 items

## 2020-08-19 NOTE — Anesthesia Preprocedure Evaluation (Signed)
Anesthesia Evaluation  Patient identified by MRN, date of birth, ID band Patient awake    Reviewed: Allergy & Precautions, NPO status , Patient's Chart, lab work & pertinent test results  Airway Mallampati: II  TM Distance: >3 FB Neck ROM: Full    Dental no notable dental hx.    Pulmonary neg pulmonary ROS, Current Smoker and Patient abstained from smoking.,    Pulmonary exam normal breath sounds clear to auscultation       Cardiovascular hypertension, Pt. on medications + Past MI  Normal cardiovascular exam Rhythm:Regular Rate:Normal     Neuro/Psych Anxiety Depression negative neurological ROS  negative psych ROS   GI/Hepatic negative GI ROS, Neg liver ROS,   Endo/Other  negative endocrine ROS  Renal/GU negative Renal ROS  negative genitourinary   Musculoskeletal  (+) Arthritis , Osteoarthritis,    Abdominal   Peds negative pediatric ROS (+)  Hematology negative hematology ROS (+) anemia ,   Anesthesia Other Findings   Reproductive/Obstetrics negative OB ROS                             Anesthesia Physical Anesthesia Plan  ASA: 3  Anesthesia Plan: MAC   Post-op Pain Management:    Induction: Intravenous  PONV Risk Score and Plan: 0 and Treatment may vary due to age or medical condition  Airway Management Planned: Nasal Cannula  Additional Equipment:   Intra-op Plan:   Post-operative Plan:   Informed Consent: I have reviewed the patients History and Physical, chart, labs and discussed the procedure including the risks, benefits and alternatives for the proposed anesthesia with the patient or authorized representative who has indicated his/her understanding and acceptance.     Dental advisory given  Plan Discussed with: CRNA  Anesthesia Plan Comments:         Anesthesia Quick Evaluation

## 2020-08-19 NOTE — Anesthesia Postprocedure Evaluation (Signed)
Anesthesia Post Note  Patient: RYON LAYTON  Procedure(s) Performed: ESOPHAGOGASTRODUODENOSCOPY (EGD) WITH PROPOFOL     Patient location during evaluation: Endoscopy Anesthesia Type: MAC Level of consciousness: awake and alert Pain management: pain level controlled Vital Signs Assessment: post-procedure vital signs reviewed and stable Respiratory status: spontaneous breathing, nonlabored ventilation and respiratory function stable Cardiovascular status: blood pressure returned to baseline and stable Postop Assessment: no apparent nausea or vomiting Anesthetic complications: no   No notable events documented.  Last Vitals:  Vitals:   08/19/20 1215 08/19/20 1325  BP: (!) 155/65 (!) 112/44  Pulse: 63 (!) 57  Resp: 17 14  Temp: 36.6 C 36.6 C  SpO2: 100% 99%    Last Pain:  Vitals:   08/19/20 1325  TempSrc: Temporal  PainSc: 0-No pain                 Lynda Rainwater

## 2020-08-19 NOTE — Plan of Care (Signed)
  Problem: Education: Goal: Understanding of cardiac disease, CV risk reduction, and recovery process will improve Outcome: Progressing   Problem: Activity: Goal: Ability to tolerate increased activity will improve Outcome: Progressing   Problem: Cardiac: Goal: Ability to achieve and maintain adequate cardiovascular perfusion will improve Outcome: Progressing   Problem: Education: Goal: Knowledge of General Education information will improve Description: Including pain rating scale, medication(s)/side effects and non-pharmacologic comfort measures Outcome: Progressing   Problem: Health Behavior/Discharge Planning: Goal: Ability to manage health-related needs will improve Outcome: Progressing   Problem: Clinical Measurements: Goal: Ability to maintain clinical measurements within normal limits will improve Outcome: Progressing Goal: Will remain free from infection Outcome: Progressing Goal: Diagnostic test results will improve Outcome: Progressing Goal: Respiratory complications will improve Outcome: Progressing Goal: Cardiovascular complication will be avoided Outcome: Progressing   Problem: Nutrition: Goal: Adequate nutrition will be maintained Outcome: Progressing   Problem: Coping: Goal: Level of anxiety will decrease Outcome: Progressing

## 2020-08-19 NOTE — Progress Notes (Signed)
Subjective:  Patient denies any chest pain or shortness of breath.  Overall feels well.  Objective:  Vital Signs in the last 24 hours: Temp:  [98.2 F (36.8 C)-98.7 F (37.1 C)] 98.2 F (36.8 C) (08/03 0800) Pulse Rate:  [57-68] 66 (08/03 0937) Resp:  [14-22] 17 (08/03 0800) BP: (119-157)/(64-93) 154/86 (08/03 0937) SpO2:  [95 %-100 %] 97 % (08/03 0800)  Intake/Output from previous day: 08/02 0701 - 08/03 0700 In: 1075.4 [P.O.:480; I.V.:595.4] Out: 1250 [Urine:1250] Intake/Output from this shift: Total I/O In: -  Out: 800 [Urine:800]  Physical Exam: Neck: no adenopathy, no carotid bruit, no JVD, and supple, symmetrical, trachea midline Lungs: clear to auscultation bilaterally Heart: regular rate and rhythm, S1, S2 normal, and 2/6 systolic murmur noted Abdomen: soft, non-tender; bowel sounds normal; no masses,  no organomegaly Extremities: extremities normal, atraumatic, no cyanosis or edema and right groin stable  Lab Results: Recent Labs    08/18/20 0204 08/19/20 0041  WBC 8.5 8.4  HGB 8.0* 8.8*  PLT 322 366   Recent Labs    08/18/20 0204 08/19/20 0041  NA 135 136  K 4.1 4.1  CL 102 101  CO2 25 28  GLUCOSE 96 99  BUN 15 13  CREATININE 1.18 1.11   No results for input(s): TROPONINI in the last 72 hours.  Invalid input(s): CK, MB Hepatic Function Panel Recent Labs    08/16/20 1205  PROT 8.1  ALBUMIN 3.3*  AST 69*  ALT 30  ALKPHOS 88  BILITOT 0.8   Recent Labs    08/17/20 0741  CHOL 128   No results for input(s): PROTIME in the last 72 hours.  Imaging: CARDIAC CATHETERIZATION  Result Date: 08/18/2020 Formatting of this result is different from the original.   Ost LAD to Prox LAD lesion is 100% stenosed.   Prox RCA to Mid RCA lesion is 20% stenosed.   Mid LM to Dist LM lesion is 20% stenosed.   Ost Cx to Prox Cx lesion is 30% stenosed.   1st Mrg lesion is 100% stenosed.   Prox Cx to Mid Cx lesion is 30% stenosed.    Cardiac  Studies:  Assessment/Plan:  Stable angina, status post left cardiac catheterization noted to have patent distal left main and ostial left circumflex stent Multivessel CAD, status post CABG in the past. History of MI 2 in the past, status post PCI to the protected left main and left circumflex and PTCA stenting to saphenous vein graft to obtuse marginal 1 in the past Ischemic cardiomyopathy Hypertension. Hyperlipidemia. Peripheral vascular disease. EtOH abuse. Tobacco abuse. History of recurrent atrial fibrillation on chronic anticoagulation. Gouty arthritis. Microcytic anemia, rule out GI loss versus blood loss during recent peripheral angioplasty. Prolonged QTC, secondary to amiodarone Plan Schedule for EGD today, will decide regarding anticoagulants and antiplatelet medication.  After the EGD results. Start low-dose Aldactone and SGL2 inhibitor as per orders. We will DC amlodipine.  In view of depressed LV systolic function Will restart amiodarone at a lower dose once QTC interval normalized  LOS: 3 days    Charolette Forward 08/19/2020, 11:14 AM

## 2020-08-19 NOTE — Op Note (Signed)
[facility] Patient Name: [name] Procedure Date : 08/19/2020 MRN: [id number] Attending MD: Juanita Craver , MD Date of Birth: 08-07-1950 CSN: [id number] Age: 70 Admit Type: Inpatient Procedure:                Diagnostic EGD Indications:              Iron deficiency anemia Providers:                Juanita Craver, MD, Dulcy Fanny, Tyna Jaksch                            Technician, Claybon Jabs CRNA, CRNA Referring MD:             Charolette Forward MD Medicines:                Monitored Anesthesia Care Complications:            No immediate complications. Estimated Blood Loss:     Estimated blood loss: none. Procedure:                Pre-Anesthesia Assessment: - Prior to the                            procedure, a history and physical was performed,                            and patient medications and allergies were                            reviewed. The patient's tolerance of previous                            anesthesia was also reviewed. The risks and                            benefits of the procedure and the sedation options                            and risks were discussed with the patient. All                            questions were answered, and informed consent was                            obtained. Prior Anticoagulants: The patient has                            taken Plavix (Clopidogrel), last dose was 4 days                            prior to procedure. ASA Grade Assessment: III - A                            patient with severe systemic disease. After  reviewing the risks and benefits, the patient was                            deemed in satisfactory condition to undergo the                            procedure. After obtaining informed consent, the                            endoscope was passed under direct vision.                            Throughout the procedure, the patient's blood                             pressure, pulse, and oxygen saturations were                            monitored continuously. The [id number]-[id number] ([id number])                            Olympus endoscope was introduced through the mouth,                            and advanced to the second part of duodenum. The                            EGD was accomplished without difficulty. The                            patient tolerated the procedure well. Scope In: Scope Out: Findings:      The examined esophagus and GEJ was widely patent and normal.      A few dispersed erosions with no stigmata of recent bleeding were found       in the gastric body and in the gastric antrum.      A small hiatal hernia was noted on retroflexion.      The examined duodenum was normal. Impression:               - Normal appearing, widely patent esophagus and GEJ.                           - Erosive gastropathy with no stigmata of recent                            bleeding.                           - Small hiatal hernia.                           - Normal appearing proximal duodenum.                           - No specimens collected. Moderate Sedation:  MAC used. Recommendation:           - Clear liquid diet today; advance as tolerated.                           - Continue present medications.                           - Return to my office in 1 week. Procedure Code(s):        --- Professional ---                           [phone], Esophagogastroduodenoscopy, flexible,                            transoral; diagnostic, including collection of                            specimen(s) by brushing or washing, when performed                            (separate procedure) Diagnosis Code(s):        --- Professional ---                           D50.9, Iron deficiency anemia, unspecified                           K44.9, Diaphragmatic hernia without obstruction or                            gangrene                           K92.2, Gastrointestinal  hemorrhage, unspecified CPT copyright 2019 [employer]. All rights reserved. The codes documented in this report are preliminary and upon coder review may  be revised to meet current compliance requirements. Juanita Craver, MD Juanita Craver, MD 08/19/2020 1:36:26 PM This report has been signed electronically. Number of Addenda: 0

## 2020-08-19 NOTE — Transfer of Care (Signed)
Immediate Anesthesia Transfer of Care Note  Patient: [name]  Procedure(s) Performed: ESOPHAGOGASTRODUODENOSCOPY (EGD) WITH PROPOFOL  Patient Location: Endoscopy Unit  Anesthesia Type:MAC  Level of Consciousness: drowsy and patient cooperative  Airway & Oxygen Therapy: Patient Spontanous Breathing and Patient connected to nasal cannula oxygen  Post-op Assessment: Report given to RN and Post -op Vital signs reviewed and stable  Post vital signs: Reviewed and stable  Last Vitals:  Vitals Value Taken Time  BP    Temp    Pulse 57 08/19/20 1326  Resp 14 08/19/20 1326  SpO2 98 % 08/19/20 1326  Vitals shown include unvalidated device data.  Last Pain:  Vitals:   08/19/20 1215  TempSrc: Temporal  PainSc: 0-No pain         Complications: No notable events documented.

## 2020-08-19 NOTE — Care Management Important Message (Signed)
Important Message  Patient Details  Name: [name] MRN: [id number] Date of Birth: 12-19-1950   Medicare Important Message Given:  Yes     [name] 08/19/2020, 1:57 PM

## 2020-08-20 ENCOUNTER — Encounter (HOSPITAL_COMMUNITY): Payer: Self-pay | Admitting: Gastroenterology

## 2020-08-20 LAB — CBC
HCT: 28.7 % — ABNORMAL LOW (ref 39.0–52.0)
Hemoglobin: 8.5 g/dL — ABNORMAL LOW (ref 13.0–17.0)
MCH: 20.9 pg — ABNORMAL LOW (ref 26.0–34.0)
MCHC: 29.6 g/dL — ABNORMAL LOW (ref 30.0–36.0)
MCV: 70.5 fL — ABNORMAL LOW (ref 80.0–100.0)
Platelets: 356 10*3/uL (ref 150–400)
RBC: 4.07 MIL/uL — ABNORMAL LOW (ref 4.22–5.81)
RDW: 22.6 % — ABNORMAL HIGH (ref 11.5–15.5)
WBC: 7.4 10*3/uL (ref 4.0–10.5)
nRBC: 0 % (ref 0.0–0.2)

## 2020-08-20 MED ORDER — EMPAGLIFLOZIN 10 MG PO TABS
10.0000 mg | ORAL_TABLET | Freq: Every day | ORAL | Status: DC
  Filled 2020-08-20: qty 1

## 2020-08-20 MED ORDER — AMIODARONE HCL 200 MG PO TABS: 100.0000 mg | ORAL_TABLET | Freq: Every day | ORAL | 3 refills | Status: AC

## 2020-08-20 MED ORDER — SPIRONOLACTONE 25 MG PO TABS: 12.5000 mg | ORAL_TABLET | Freq: Every day | ORAL | 3 refills | Status: AC

## 2020-08-20 MED ORDER — SACUBITRIL-VALSARTAN 24-26 MG PO TABS: 1.0000 | ORAL_TABLET | Freq: Two times a day (BID) | ORAL | 3 refills | Status: AC

## 2020-08-20 MED ORDER — CARVEDILOL 6.25 MG PO TABS: 6.2500 mg | ORAL_TABLET | Freq: Two times a day (BID) | ORAL | 3 refills | Status: AC

## 2020-08-20 MED ORDER — PANTOPRAZOLE SODIUM 40 MG PO TBEC: 40.0000 mg | DELAYED_RELEASE_TABLET | Freq: Every day | ORAL | 3 refills | Status: AC

## 2020-08-20 MED ORDER — EMPAGLIFLOZIN 10 MG PO TABS: 10.0000 mg | ORAL_TABLET | Freq: Every day | ORAL | 3 refills | Status: AC

## 2020-08-20 MED ORDER — SPIRONOLACTONE 12.5 MG HALF TABLET
12.5000 mg | ORAL_TABLET | Freq: Every day | ORAL | Status: DC
  Administered 2020-08-20: 12.5 mg via ORAL
  Filled 2020-08-20: qty 1

## 2020-08-20 NOTE — Discharge Summary (Signed)
[name], [name] MEDICAL RECORD NO: [id number] ACCOUNT NO: [id number] DATE OF BIRTH: 12/13/1950 FACILITY: [facility] LOCATION: [facility] PHYSICIAN: Yared Susan N. Terrence Dupont, MD  Discharge Summary   DATE OF ADMISSION:  08/16/2020.  DATE OF DISCHARGE:  08/20/2020.  ADMITTING DIAGNOSES: 1.  Acute coronary syndrome. 2.  Multivessel CAD, status post CABG in the past, history of MI x2 in the past, status post PCI to the protected left main and left circumflex and PTCA stenting to saphenous vein graft to obtuse marginal 1 in the past. 3.  Hypertension. 4.  Hyperlipidemia. 5.  Peripheral vascular disease. 6.  ETOH abuse. 7.  Tobacco abuse. 8.  History of recurrent atrial fibrillation, on chronic anticoagulation. 9.  Gouty arthritis. 10.  Microcytic anemia, rule out gastrointestinal blood loss versus blood loss during the recent peripheral angioplasty. 11.  Prolonged QTc secondary to amiodarone.  DISCHARGE DIAGNOSES: 1.  Stable angina, status post left acute on chronic anemia, stable, status post EGD, noted to have gastritis.  No obvious evidence of bleeding. 2.  Hypertension. 3.  Hyperlipidemia. 4.  Peripheral vascular disease. 5.  Alcohol abuse. 6.  Tobacco abuse. 7.  History of recurrent AFib, on chronic anticoagulation.  Remains in sinus rhythm. 8.  Gouty arthritis. 9.  Microcytic anemia, stable. 10.  Status post hyperkalemia. 11.  Status post prolonged QTc secondary to amiodarone.  DISCHARGE HOME MEDICATIONS: 1.  Carvedilol 6.25 mg 1 tablet twice daily. 2.  Jardiance 10 mg daily. 3.  Pantoprazole 40 mg daily. 4.  Entresto 24/26 mg twice daily. 5.  Spironolactone 25 mg half tablet daily. 6.  Allopurinol 300 mg daily. 7.  Clopidogrel 75 mg daily. 8.  Eliquis 5 mg twice daily. 9.  Nicotine lozenges 4 mg as needed. 10.  Nitrostat 0.4 mg sublingual p.r.n. 11.  Amiodarone 200 mg half tablet daily. 12.  Atorvastatin 40 mg daily.  The patient has been advised to stop aspirin, cilostazol,  losartan and metoprolol succinate.  DIET:  Low salt, low cholesterol diet.  ACTIVITY:  Increase activity slowly as tolerated.  Post-cardiac catheterization instructions have been given.  Follow up with me in 1 week.  Follow up with GI as scheduled.  CONDITION AT DISCHARGE:  Stable.  BRIEF HISTORY AND HOSPITAL COURSE:  The patient is a 70 year old male with past medical history significant for multivessel coronary artery disease, history of MI x2 in the past, status post CABG in 1989.  He had LIMA to LAD and saphenous vein graft to  obtuse marginal #1, status post PTCA and stenting to saphenous vein graft to OM1 and also PCI to protected left main and left circumflex in December of 2006, history of atrial flutter, status post ablation in 2003, history of AFib with RVR, subsequently  placed on amiodarone in January 2022 and converted back to sinus rhythm, on chronic anticoagulation and not followed in the office since then.  Hypertension, hyperlipidemia, peripheral vascular disease, status post PTA to right leg and attempted PTA to  left leg recently, tobacco abuse, ETOH abuse, history of gouty arthritis.  He came to the ER from urgent care complaining of right sided abdominal pain and retrosternal chest pain radiating to both shoulders grade 4/10 for approximately 3 weeks off and  on, associated with nausea, diaphoresis and mild shortness of breath.  The patient states he has not used any nitro recently, but as the pain got worse, decided to come to urgent care.  The patient had EKG done at urgent care, which showed normal sinus  rhythm  with prolonged QTc interval and marked ST-T wave depression in inferior and anterolateral leads.  In the Emergency Department, the patient was also noted to be anemic.  The patient denies any black tarry stools.  Denies any bright red blood per  rectum.  Denies hematuria.  The patient states he was placed on Plavix, now takes occasionally aspirin and continues to take  Eliquis for atrial fibrillation, states continues to drink 3-4 beers and 4-5 shots of hard liquor daily and smokes less than half  pack per day now, states he used to smoke 1 pack per day for 40+ years and now has cut down to half a pack per day and in process of quitting alcohol and smoking.  PHYSICAL EXAMINATION: GENERAL:  He was alert, awake, oriented x3. VITAL SIGNS:  Blood pressure was 196/105, pulse 57.  He was afebrile. HEENT:  Conjunctivae was pink. NECK:  Supple.  No JVD. CARDIAC:  He was bradycardic.  There was a II/VI systolic murmur and S4 gallop noted. LUNGS:  Decreased breath sound at bases. ABDOMEN:  Soft.  Bowel sounds present, nontender. EXTREMITIES:  There is no clubbing, cyanosis or edema. NEUROLOGICAL:  Grossly intact.  LABORATORY DATA:  His sodium was 134, potassium was 7.1, repeat potassium was 5.1, BUN 20, creatinine 1.45.  There is high sensitivity, troponin I was elevated at 338, 323, 400 and 397, which is trending down, hemoglobin was 9.2, hematocrit 31.6, white  count of 9.5, hemoglobin A1c was 5.7.  2D echo showed global hypokinesia with EF of 35-40%.  There was mild-to-moderate mitral regurgitation.  The patient also underwent upper endoscopy, which showed no evidence of active bleeding with evidence of  diffuse gastritis.  Last hemoglobin is 8.5, hematocrit 28.7, white count of 7.4, which has been stable.  BRIEF HOSPITAL COURSE:  The patient was admitted to the ICU.  The patient ruled in for small non-Q-wave myocardial infarction due to elevated enzymes and typical anginal chest pain.  The patient subsequently underwent left cardiac catheterization with  selective left and right coronary angiography, visualization of LIMA graft and saphenous vein graft as per procedure report, the patient tolerated the procedure well.  The patient did not have any episodes of chest pain during the hospital stay.  The  patient did drop his hemoglobin down to 8 grams.  GI consultation  was obtained with Dr. Collene Mares.  Subsequently, the patient underwent upper endoscopy as above.  The patient tolerated the procedure well.  His hemoglobin has been stable.  The patient was  restarted on Eliquis and clopidogrel.  Aspirin has been discontinued.  The patient has been counseled extensively regarding smoking cessation, alcohol cessation and compliance with medication and followup.  The patient's groin is stable with no evidence  of hematoma or bruit.  Post-cardiac catheterization instructions have been given.  The patient will be discharged home and will be followed up in my office in 1 week and with GI in 2-3 weeks.   PUS D: 08/20/2020 12:48:04 pm T: 08/20/2020 1:38:00 pm  JOB: [id number]

## 2020-08-20 NOTE — Discharge Summary (Signed)
Discharge summary dictated on 08/20/2020, dictation number is [id number]

## 2020-08-21 ENCOUNTER — Other Ambulatory Visit: Payer: Self-pay

## 2020-08-21 NOTE — Patient Outreach (Signed)
[facility]) Care Management  08/21/2020  [name] [birth year] [id number]     Transition of Care Referral  Referral Date: 08/21/2020 Referral Source: D/C Report Date of Discharge: 08/20/2020 Facility: [facility]   Referral received. Transition of care calls being completed via EMMI-automated calls.     Plan: RN CM will close referral.   Enzo Montgomery, RN,BSN,CCM [place] [facility] Telephonic Care Management Coordinator Direct Phone: [phone] Toll Free: [phone] Fax: [phone]

## 2020-08-24 MED FILL — Heparin Sod (Porcine)-NaCl IV Soln 1000 Unit/500ML-0.9%: INTRAVENOUS | Qty: 1000 | Status: AC

## 2020-08-28 DIAGNOSIS — I5020 Unspecified systolic (congestive) heart failure: Secondary | ICD-10-CM | POA: Diagnosis not present

## 2020-08-28 DIAGNOSIS — I739 Peripheral vascular disease, unspecified: Secondary | ICD-10-CM | POA: Diagnosis not present

## 2020-08-28 DIAGNOSIS — I10 Essential (primary) hypertension: Secondary | ICD-10-CM | POA: Diagnosis not present

## 2020-08-28 DIAGNOSIS — I255 Ischemic cardiomyopathy: Secondary | ICD-10-CM | POA: Diagnosis not present

## 2020-08-28 DIAGNOSIS — F17290 Nicotine dependence, other tobacco product, uncomplicated: Secondary | ICD-10-CM | POA: Diagnosis not present

## 2020-08-28 DIAGNOSIS — E785 Hyperlipidemia, unspecified: Secondary | ICD-10-CM | POA: Diagnosis not present

## 2020-08-28 DIAGNOSIS — I480 Paroxysmal atrial fibrillation: Secondary | ICD-10-CM | POA: Diagnosis not present

## 2020-08-28 DIAGNOSIS — I25118 Atherosclerotic heart disease of native coronary artery with other forms of angina pectoris: Secondary | ICD-10-CM | POA: Diagnosis not present

## 2020-10-08 DIAGNOSIS — I255 Ischemic cardiomyopathy: Secondary | ICD-10-CM | POA: Diagnosis not present

## 2020-10-08 DIAGNOSIS — I480 Paroxysmal atrial fibrillation: Secondary | ICD-10-CM | POA: Diagnosis not present

## 2020-10-08 DIAGNOSIS — I5020 Unspecified systolic (congestive) heart failure: Secondary | ICD-10-CM | POA: Diagnosis not present

## 2020-10-08 DIAGNOSIS — I25118 Atherosclerotic heart disease of native coronary artery with other forms of angina pectoris: Secondary | ICD-10-CM | POA: Diagnosis not present

## 2020-10-14 DIAGNOSIS — I499 Cardiac arrhythmia, unspecified: Secondary | ICD-10-CM | POA: Diagnosis not present

## 2020-10-17 DIAGNOSIS — 419620001 Death: Secondary | SNOMED CT | POA: Diagnosis not present

## 2020-10-17 DEATH — deceased

## 2020-10-24 NOTE — Progress Notes (Signed)
Erroneous encounter

## 2020-10-28 ENCOUNTER — Encounter: Payer: Medicare HMO | Admitting: Family

## 2020-10-28 DIAGNOSIS — Z7689 Persons encountering health services in other specified circumstances: Secondary | ICD-10-CM

## 2022-02-23 IMAGING — CT CT ABD-PELV W/ CM
2 of 5 series · 16 of 46 positions shown, 18 images · IV contrast (omnipaque)
Comparison: CT abdomen 05/15/2008

CLINICAL DATA: Right lower quadrant pain

EXAM:
CT ABDOMEN AND PELVIS WITH CONTRAST
TECHNIQUE: Multidetector CT imaging of the abdomen and pelvis was performed
using the standard protocol following bolus administration of
intravenous contrast.
CONTRAST:  100mL OMNIPAQUE IOHEXOL 300 MG/ML  SOLN

[Series 3: a/p w/ 5mm · axial · 0.71mm/px · z∈[+853,+1288]mm · 13 of 97 slices shown, 15 images]
[im 5/97  soft-tissue]
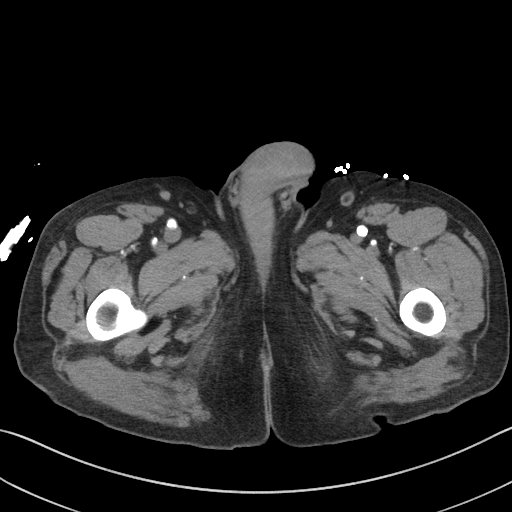
[im 5/97  bone]
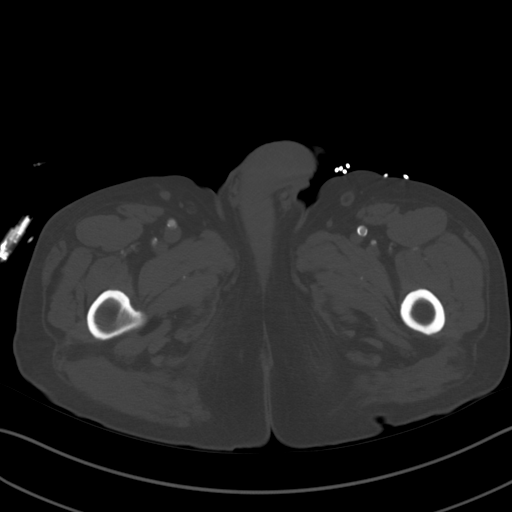
[im 15/97  soft-tissue]
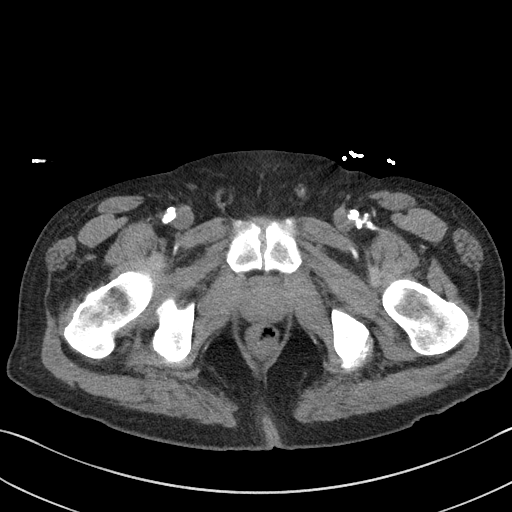
[im 20/97  soft-tissue]
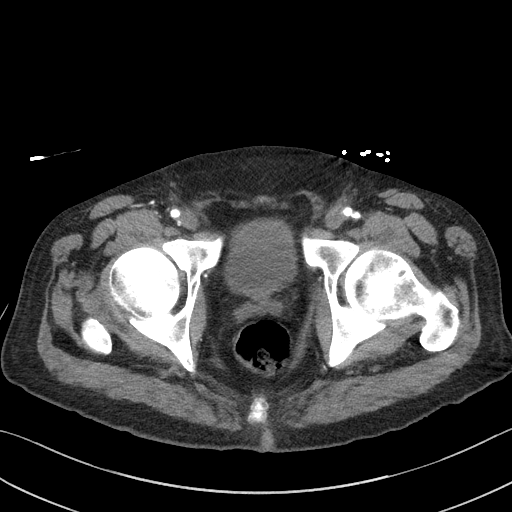
[im 29/97  soft-tissue]
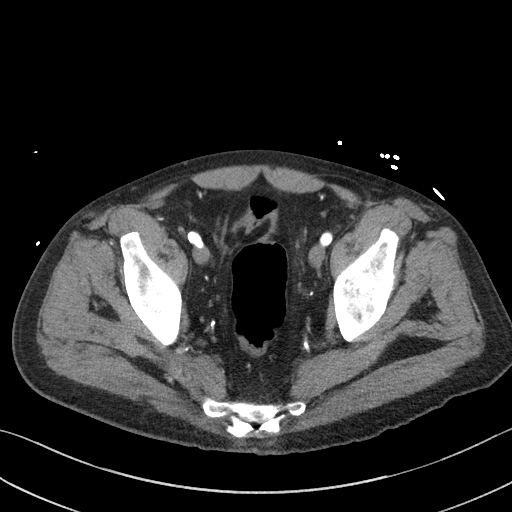
[im 34/97  soft-tissue]
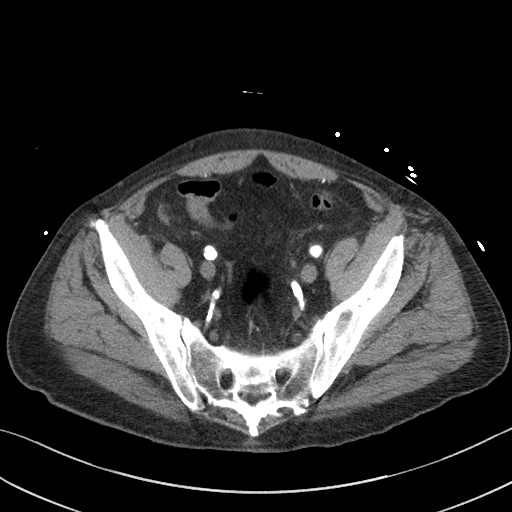
[im 44/97  soft-tissue]
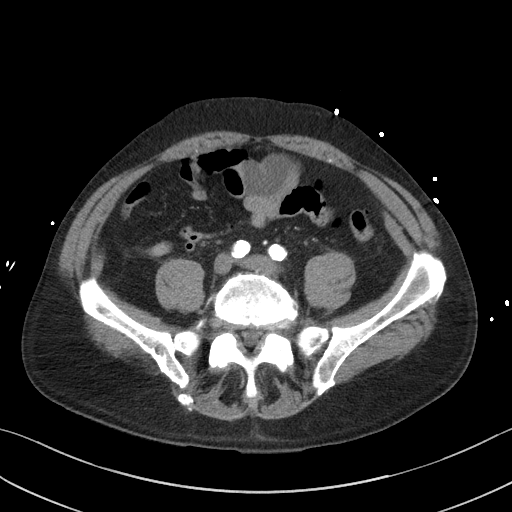
[im 49/97  soft-tissue]
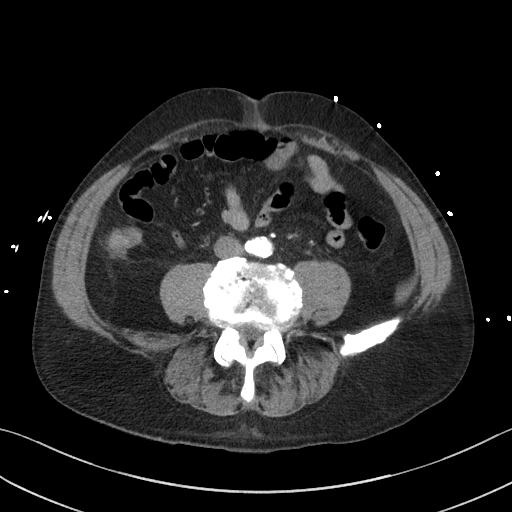
[im 53/97  soft-tissue]
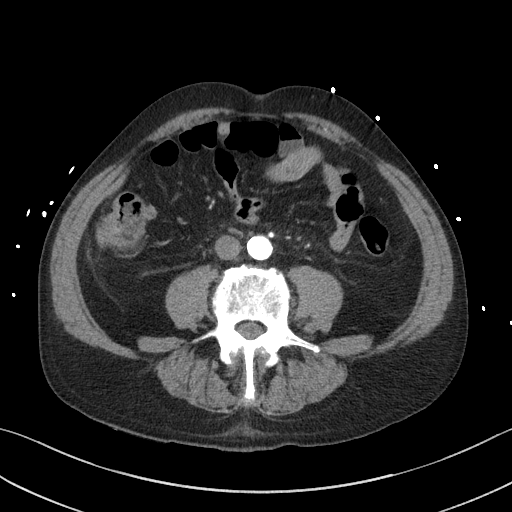
[im 63/97  soft-tissue]
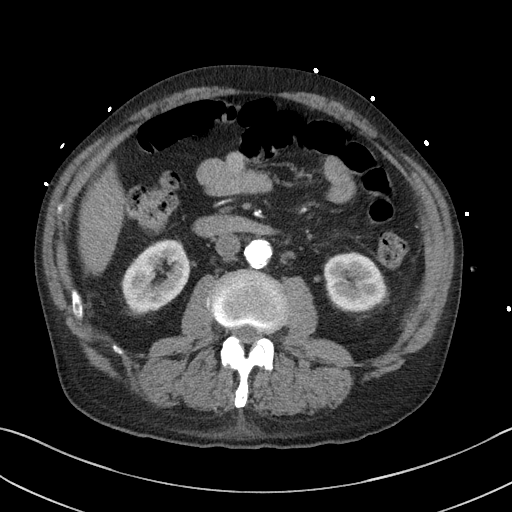
[im 63/97  bone]
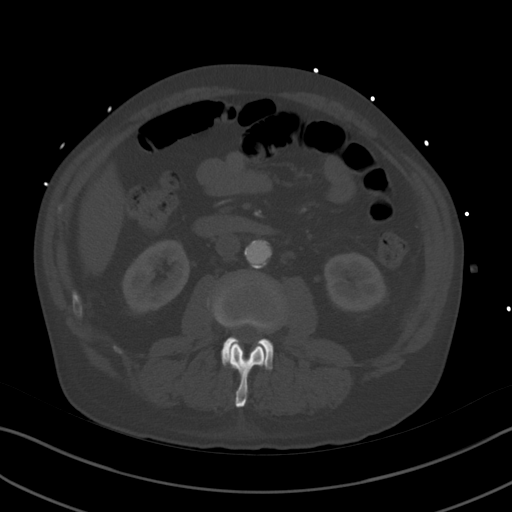
[im 68/97  soft-tissue]
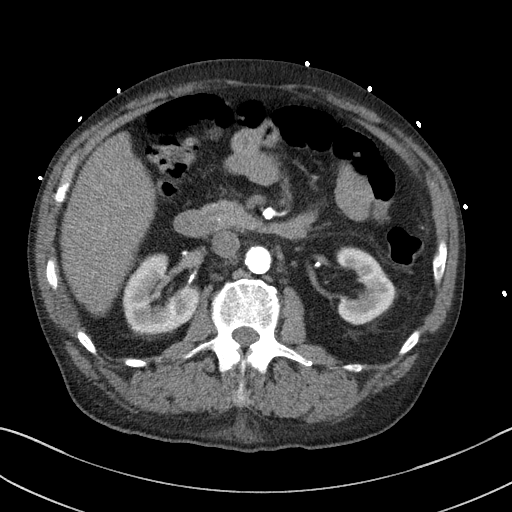
[im 77/97  soft-tissue]
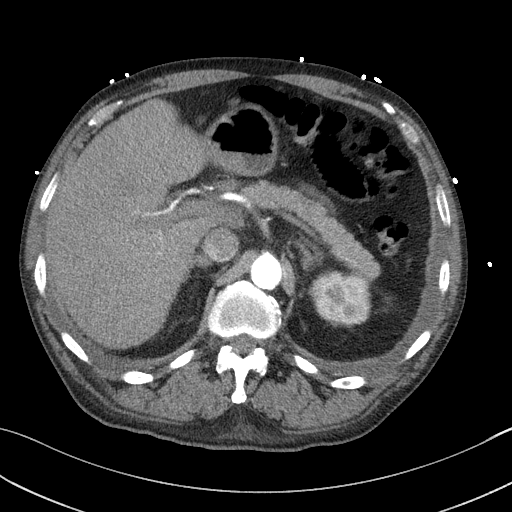
[im 82/97  soft-tissue]
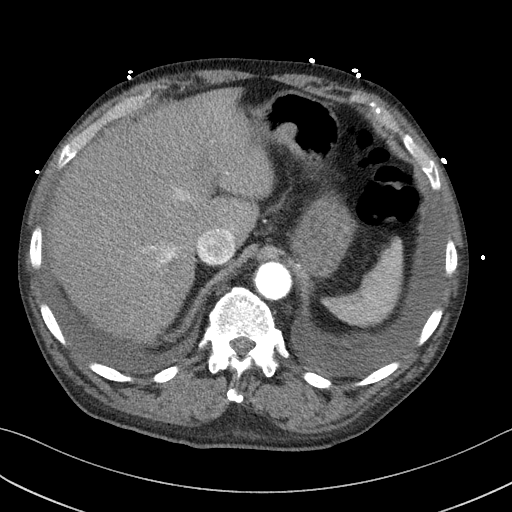
[im 92/97  soft-tissue]
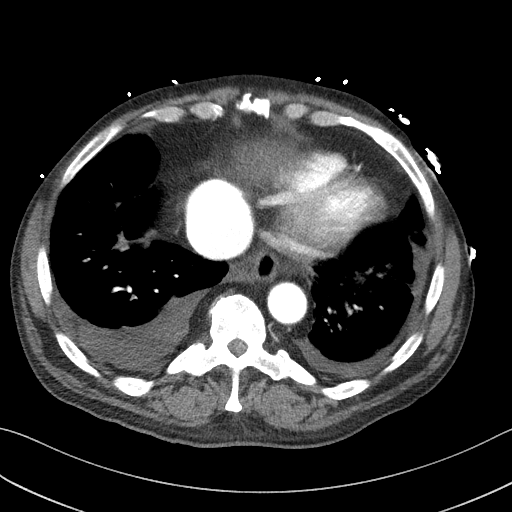

[Series 6: a/p w/ cor · coronal · 0.78mm/px · 3 of 140 slices shown]
[im 47/140  soft-tissue]
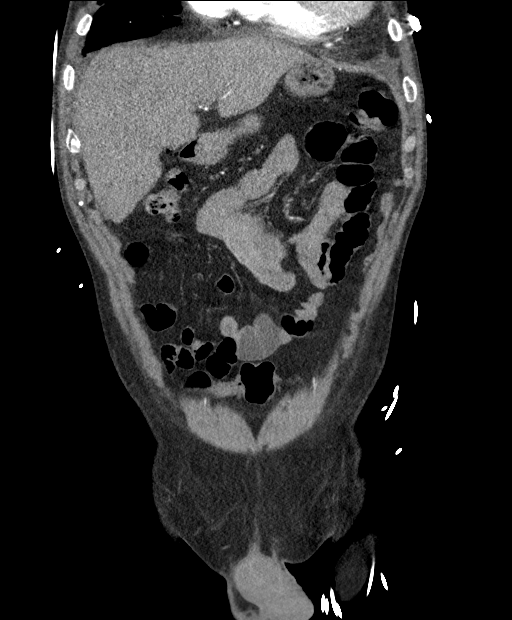
[im 62/140  soft-tissue]
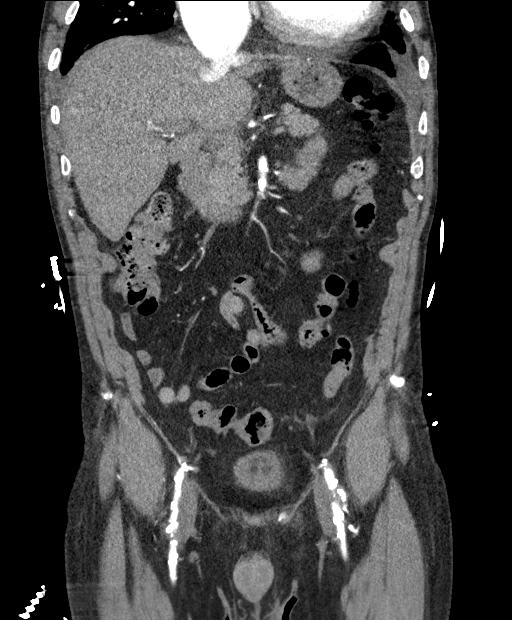
[im 78/140  soft-tissue]
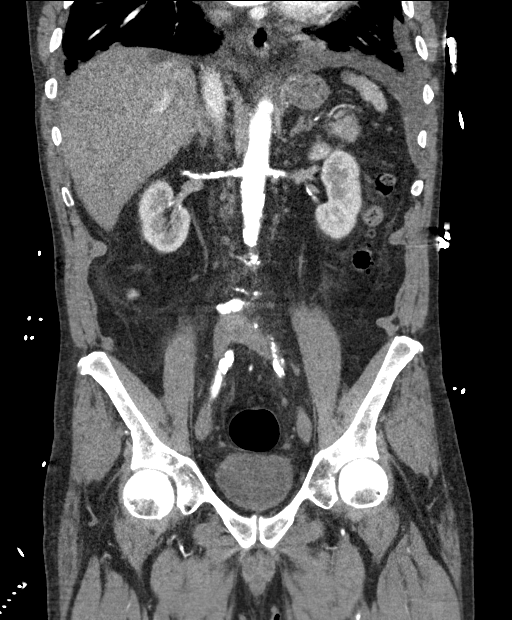

[16 of 46 positions shown; findings below may reference images not displayed]

FINDINGS: Lower chest: Bilateral small pleural effusions and adjacent linear
opacities which could reflect atelectasis.

Hepatobiliary: There is a cyst in the left hepatic lobe measuring
3.3 cm an additional scattered tiny hypodensities are too small to
fully characterize but likely represent small cysts. Gallbladder
surgically absent. No new intra or extrahepatic biliary duct
dilation.

Pancreas: Unremarkable. No pancreatic ductal dilatation or
surrounding inflammatory changes.

Spleen: Normal in size without focal abnormality.

Adrenals/Urinary Tract: Adrenal glands are unremarkable. Kidneys are
normal, without renal calculi, focal lesion, or hydronephrosis.
Bladder appears slightly thick-walled but also under distended.

Stomach/Bowel: Stomach is within normal limits. Appendix appears
normal. No evidence of bowel wall thickening, distention, or
inflammatory changes.

Vascular/Lymphatic: Severe aortic atherosclerosis. No enlarged
abdominal or pelvic lymph nodes.

Reproductive: Prostate is unremarkable.

Other: No abdominopelvic ascites.

Musculoskeletal: Severe degenerative disc disease at L4-5.
IMPRESSION: 1. No acute intra-abdominal pathology to explain the patient's right
lower quadrant pain. Normal appendix.
2. Urinary bladder appears slightly thick-walled but also under
distended. Recommend correlation with clinical history and possible
urinalysis.
3. Bilateral small pleural effusions and adjacent linear opacities
which could reflect atelectasis.
4. Severe aortic atherosclerosis.  No aneurysm.

Aortic Atherosclerosis ([id number]).

## 2022-02-23 IMAGING — DX DG CHEST 1V PORT
1 series · 1 of 1 positions shown · non-contrast
Comparison: 09/21/2012

CLINICAL DATA: Chest pain

EXAM:
PORTABLE CHEST 1 VIEW

[chest]
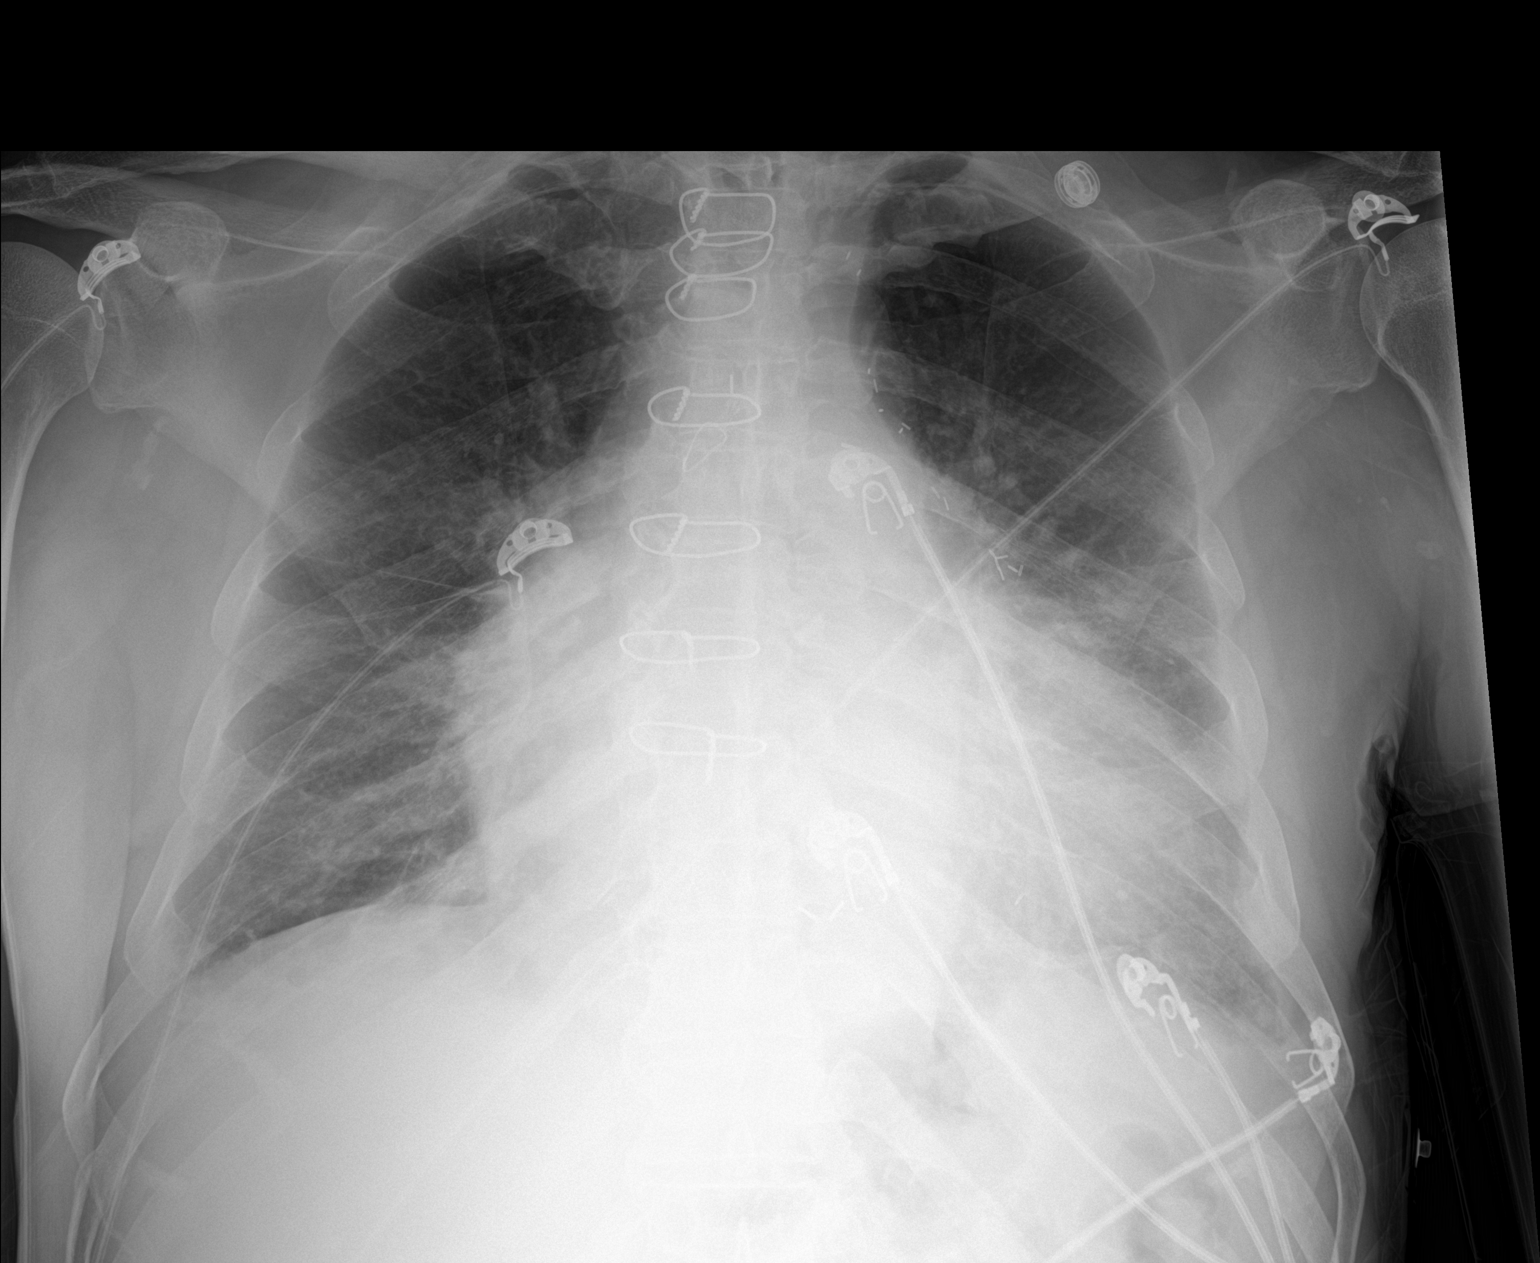

[1 of 1 positions shown; findings below may reference images not displayed]

FINDINGS: Cardiomegaly, vascular congestion. Perihilar opacities likely
reflect edema. Small bilateral effusions. No acute bony abnormality.
Prior CABG.
IMPRESSION: Cardiomegaly with vascular congestion and perihilar opacities,
likely mild edema.

Small effusions.
